# Patient Record
Sex: Female | Born: 1935
Health system: Southern US, Community
[De-identification: ages and names within clinical notes are randomized; demographics above are authoritative.]

## PROBLEM LIST (undated history)

## (undated) DIAGNOSIS — J45909 Unspecified asthma, uncomplicated: Secondary | ICD-10-CM

## (undated) DIAGNOSIS — I10 Essential (primary) hypertension: Secondary | ICD-10-CM

## (undated) HISTORY — PX: ABDOMINAL HYSTERECTOMY: SHX81

---

## 1993-09-30 HISTORY — PX: BREAST EXCISIONAL BIOPSY: SUR124

## 1997-09-30 HISTORY — PX: BREAST BIOPSY: SHX20

## 1998-04-19 ENCOUNTER — Ambulatory Visit (HOSPITAL_COMMUNITY): Admission: RE | Admit: 1998-04-19 | Discharge: 1998-04-19 | Payer: Self-pay | Admitting: General Surgery

## 1998-10-11 ENCOUNTER — Encounter (HOSPITAL_BASED_OUTPATIENT_CLINIC_OR_DEPARTMENT_OTHER): Payer: Self-pay | Admitting: General Surgery

## 1998-10-11 ENCOUNTER — Ambulatory Visit (HOSPITAL_COMMUNITY): Admission: RE | Admit: 1998-10-11 | Discharge: 1998-10-11 | Payer: Self-pay | Admitting: General Surgery

## 1998-11-20 ENCOUNTER — Other Ambulatory Visit: Admission: RE | Admit: 1998-11-20 | Discharge: 1998-11-20 | Payer: Self-pay | Admitting: *Deleted

## 1999-06-19 ENCOUNTER — Encounter: Payer: Self-pay | Admitting: *Deleted

## 1999-06-19 ENCOUNTER — Ambulatory Visit (HOSPITAL_COMMUNITY): Admission: RE | Admit: 1999-06-19 | Discharge: 1999-06-19 | Payer: Self-pay | Admitting: *Deleted

## 2001-03-04 ENCOUNTER — Other Ambulatory Visit: Admission: RE | Admit: 2001-03-04 | Discharge: 2001-03-04 | Payer: Self-pay | Admitting: Internal Medicine

## 2001-05-25 ENCOUNTER — Encounter: Payer: Self-pay | Admitting: Internal Medicine

## 2001-05-25 ENCOUNTER — Encounter: Admission: RE | Admit: 2001-05-25 | Discharge: 2001-05-25 | Payer: Self-pay | Admitting: Internal Medicine

## 2002-05-27 ENCOUNTER — Encounter: Payer: Self-pay | Admitting: Internal Medicine

## 2002-05-27 ENCOUNTER — Encounter: Admission: RE | Admit: 2002-05-27 | Discharge: 2002-05-27 | Payer: Self-pay | Admitting: Internal Medicine

## 2003-06-22 ENCOUNTER — Encounter: Admission: RE | Admit: 2003-06-22 | Discharge: 2003-06-22 | Payer: Self-pay | Admitting: Internal Medicine

## 2003-06-22 ENCOUNTER — Encounter: Payer: Self-pay | Admitting: Internal Medicine

## 2004-09-12 ENCOUNTER — Encounter: Admission: RE | Admit: 2004-09-12 | Discharge: 2004-09-12 | Payer: Self-pay | Admitting: Internal Medicine

## 2005-07-09 ENCOUNTER — Other Ambulatory Visit: Admission: RE | Admit: 2005-07-09 | Discharge: 2005-07-09 | Payer: Self-pay | Admitting: Internal Medicine

## 2005-12-13 ENCOUNTER — Encounter: Admission: RE | Admit: 2005-12-13 | Discharge: 2005-12-13 | Payer: Self-pay | Admitting: Internal Medicine

## 2007-07-29 ENCOUNTER — Encounter: Admission: RE | Admit: 2007-07-29 | Discharge: 2007-07-29 | Payer: Self-pay | Admitting: Internal Medicine

## 2007-12-28 ENCOUNTER — Encounter: Admission: RE | Admit: 2007-12-28 | Discharge: 2007-12-28 | Payer: Self-pay | Admitting: Internal Medicine

## 2008-01-21 ENCOUNTER — Emergency Department (HOSPITAL_COMMUNITY): Admission: EM | Admit: 2008-01-21 | Discharge: 2008-01-22 | Payer: Self-pay | Admitting: Emergency Medicine

## 2008-01-27 ENCOUNTER — Inpatient Hospital Stay (HOSPITAL_COMMUNITY): Admission: EM | Admit: 2008-01-27 | Discharge: 2008-01-29 | Payer: Self-pay | Admitting: Emergency Medicine

## 2008-07-29 ENCOUNTER — Encounter: Admission: RE | Admit: 2008-07-29 | Discharge: 2008-07-29 | Payer: Self-pay | Admitting: Internal Medicine

## 2008-11-10 ENCOUNTER — Inpatient Hospital Stay (HOSPITAL_COMMUNITY): Admission: EM | Admit: 2008-11-10 | Discharge: 2008-11-14 | Payer: Self-pay | Admitting: Emergency Medicine

## 2009-02-09 ENCOUNTER — Emergency Department (HOSPITAL_COMMUNITY): Admission: EM | Admit: 2009-02-09 | Discharge: 2009-02-09 | Payer: Self-pay | Admitting: Emergency Medicine

## 2009-07-19 ENCOUNTER — Emergency Department (HOSPITAL_COMMUNITY): Admission: EM | Admit: 2009-07-19 | Discharge: 2009-07-19 | Payer: Self-pay | Admitting: Emergency Medicine

## 2009-07-19 ENCOUNTER — Emergency Department (HOSPITAL_COMMUNITY): Admission: EM | Admit: 2009-07-19 | Discharge: 2009-07-20 | Payer: Self-pay | Admitting: Emergency Medicine

## 2009-08-04 ENCOUNTER — Encounter: Admission: RE | Admit: 2009-08-04 | Discharge: 2009-08-04 | Payer: Self-pay | Admitting: Internal Medicine

## 2010-01-07 IMAGING — CT CT HEAD W/O CM
2 series · 16 of 30 positions shown, 18 images · non-contrast
Comparison: None

CLINICAL DATA: Right leg weakness, fell 4 days ago

CT HEAD WITHOUT CONTRAST
TECHNIQUE: Contiguous axial images were obtained from the base of
the skull through the vertex without contrast.

[Series 3: bone windows · axial · 0.43mm/px · z∈[+2,+113]mm · 8 of 56 slices shown]
[im 6/56  bone]
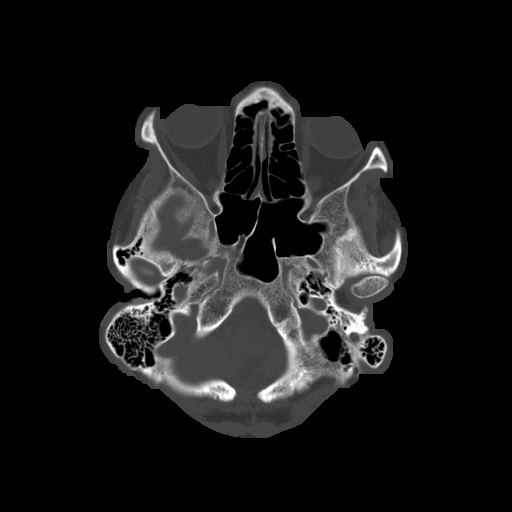
[im 12/56  bone]
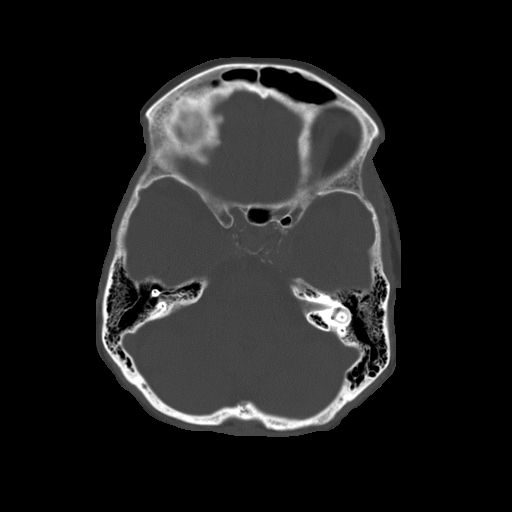
[im 18/56  bone]
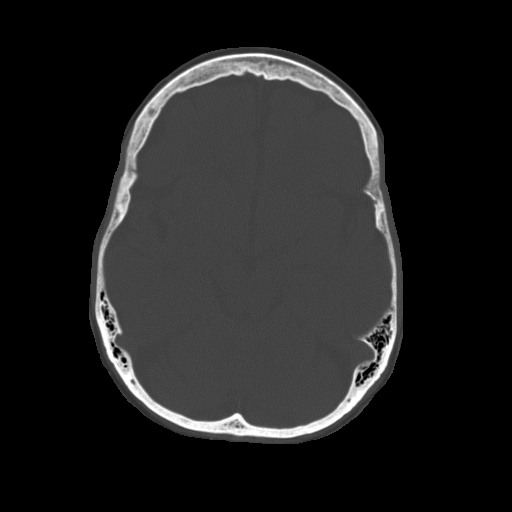
[im 24/56  bone]
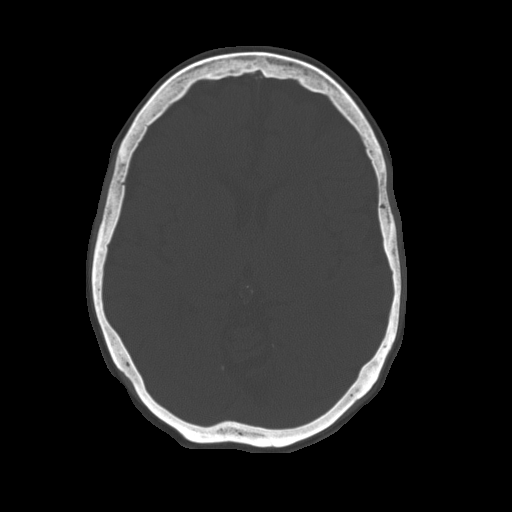
[im 32/56  bone]
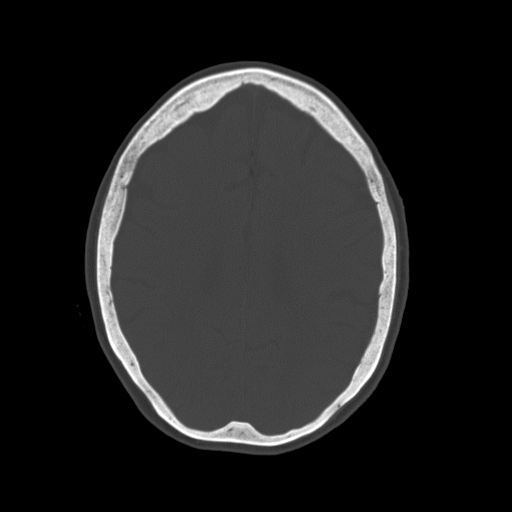
[im 38/56  bone]
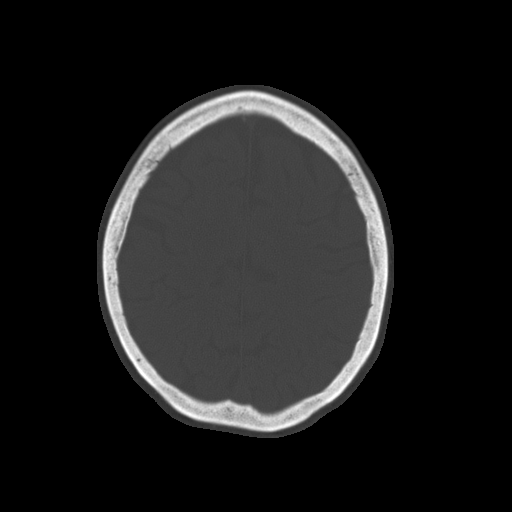
[im 44/56  bone]
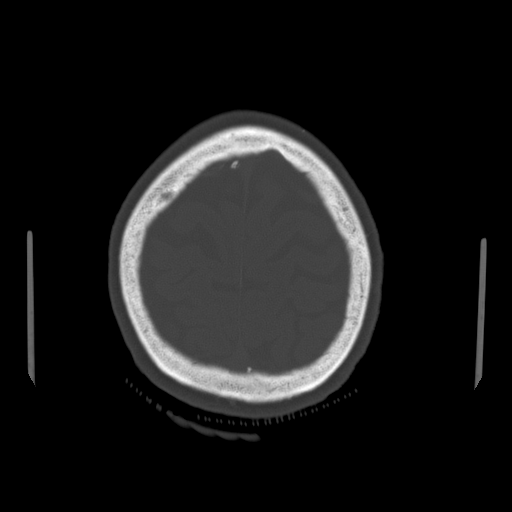
[im 50/56  bone]
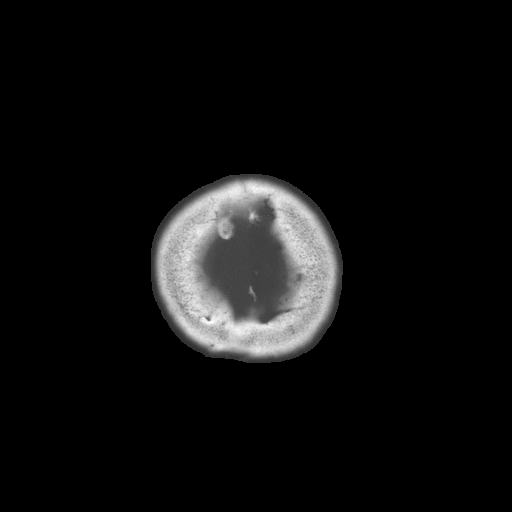

[Series 32: 3d filtered head w/o · axial · non-contrast · 0.43mm/px · z∈[+6,+111]mm · 8 of 28 slices shown, 10 images]
[im 4/28  brain]
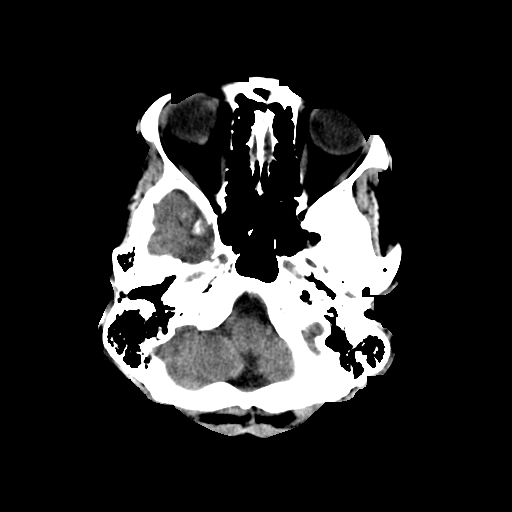
[im 4/28  bone]
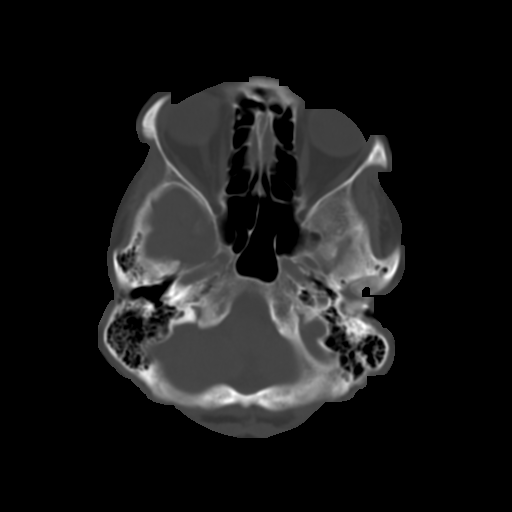
[im 7/28  brain]
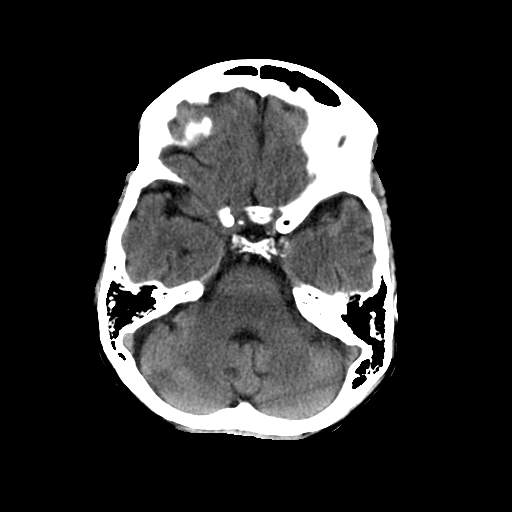
[im 10/28  brain]
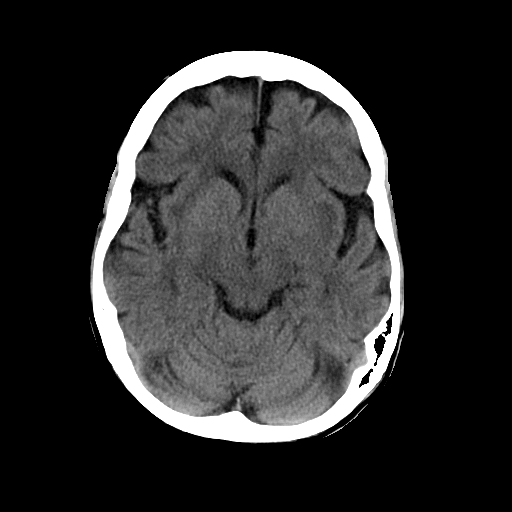
[im 13/28  brain]
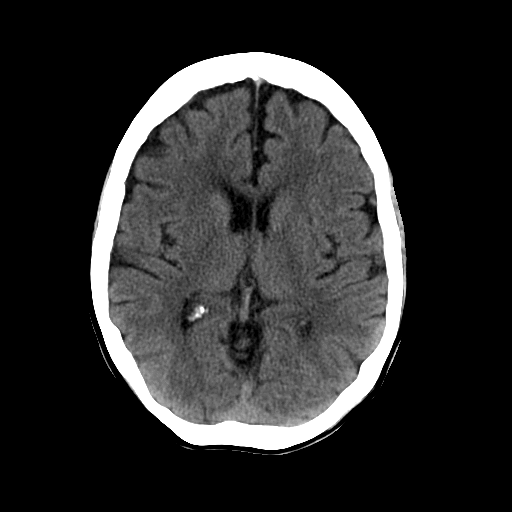
[im 16/28  brain]
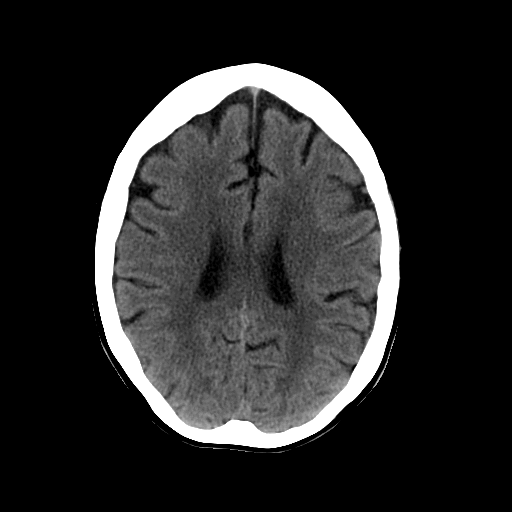
[im 16/28  bone]
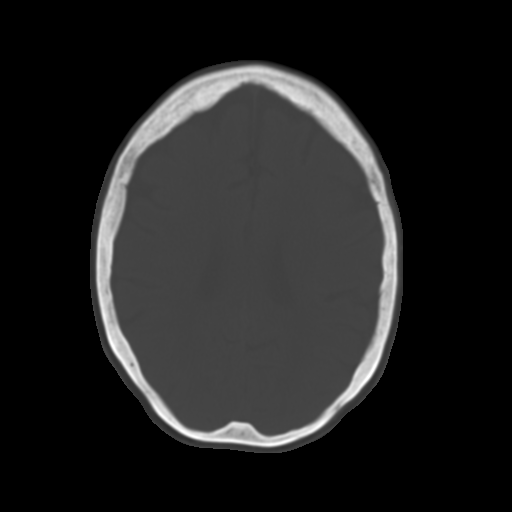
[im 19/28  brain]
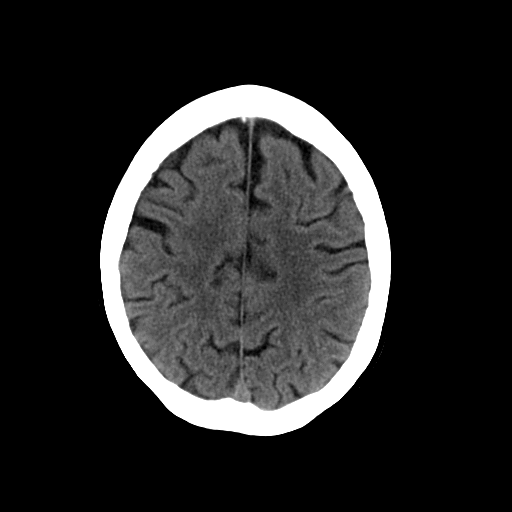
[im 22/28  brain]
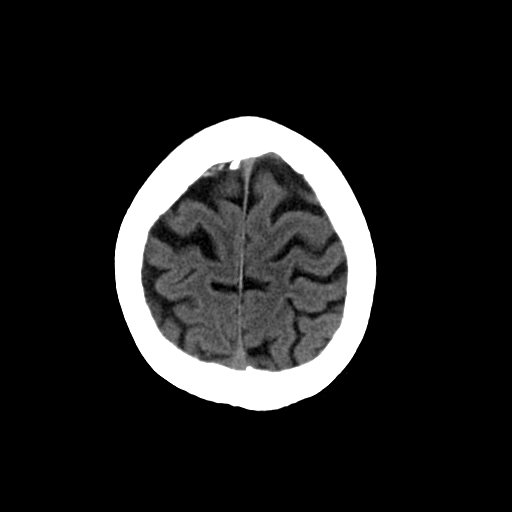
[im 25/28  brain]
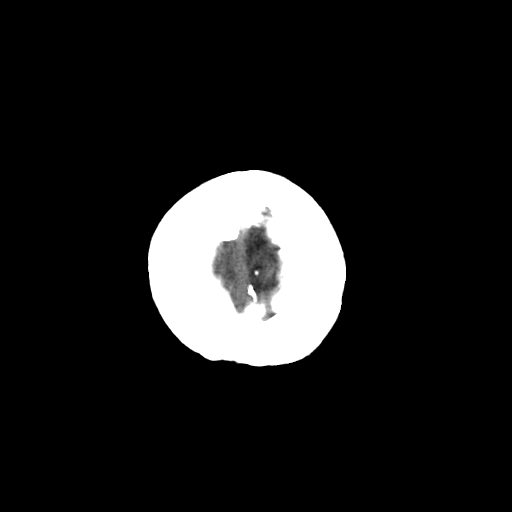

[16 of 30 positions shown; findings below may reference images not displayed]

FINDINGS: The ventricular system is within normal limits in size
and configuration.  There is cortical atrophy present diffusely.
The septum is midline in position.  Moderate small vessel ischemic
change is noted throughout the periventricular white matter.  No
blood, edema, or mass effect is seen.  No bony abnormality is
noted.
IMPRESSION: Atrophy and small vessel disease.  No acute intracranial
abnormality.

## 2010-05-24 ENCOUNTER — Encounter: Admission: RE | Admit: 2010-05-24 | Discharge: 2010-05-24 | Payer: Self-pay | Admitting: Gastroenterology

## 2010-09-19 ENCOUNTER — Encounter
Admission: RE | Admit: 2010-09-19 | Discharge: 2010-09-19 | Payer: Self-pay | Source: Home / Self Care | Attending: Internal Medicine | Admitting: Internal Medicine

## 2010-10-08 ENCOUNTER — Encounter
Admission: RE | Admit: 2010-10-08 | Discharge: 2010-10-08 | Payer: Self-pay | Source: Home / Self Care | Attending: Internal Medicine | Admitting: Internal Medicine

## 2010-10-17 ENCOUNTER — Encounter
Admission: RE | Admit: 2010-10-17 | Discharge: 2010-10-17 | Payer: Self-pay | Source: Home / Self Care | Attending: Internal Medicine | Admitting: Internal Medicine

## 2010-10-20 ENCOUNTER — Other Ambulatory Visit: Payer: Self-pay | Admitting: Gastroenterology

## 2010-10-20 DIAGNOSIS — K862 Cyst of pancreas: Secondary | ICD-10-CM

## 2010-11-09 ENCOUNTER — Other Ambulatory Visit: Payer: Self-pay

## 2011-01-03 LAB — RAPID URINE DRUG SCREEN, HOSP PERFORMED
Amphetamines: NOT DETECTED
Barbiturates: NOT DETECTED
Opiates: NOT DETECTED
Tetrahydrocannabinol: NOT DETECTED

## 2011-01-03 LAB — URINALYSIS, ROUTINE W REFLEX MICROSCOPIC
Glucose, UA: NEGATIVE mg/dL
Ketones, ur: >80 mg/dL — AB
Nitrite: NEGATIVE
Specific Gravity, Urine: 1.021 (ref 1.005–1.030)
Urobilinogen, UA: 0.2 mg/dL (ref 0.0–1.0)
pH: 6 (ref 5.0–8.0)

## 2011-01-03 LAB — CBC
HCT: 41.4 % (ref 36.0–46.0)
MCHC: 34.3 g/dL (ref 30.0–36.0)
Platelets: 319 10*3/uL (ref 150–400)
RBC: 4.45 MIL/uL (ref 3.87–5.11)

## 2011-01-03 LAB — DIFFERENTIAL
Basophils Absolute: 0 10*3/uL (ref 0.0–0.1)
Basophils Relative: 0 % (ref 0–1)
Eosinophils Absolute: 0 10*3/uL (ref 0.0–0.7)
Lymphs Abs: 2.2 10*3/uL (ref 0.7–4.0)
Monocytes Relative: 9 % (ref 3–12)

## 2011-01-03 LAB — URINE CULTURE: Colony Count: 60000

## 2011-01-03 LAB — GLUCOSE, CAPILLARY: Glucose-Capillary: 96 mg/dL (ref 70–99)

## 2011-01-03 LAB — BASIC METABOLIC PANEL
BUN: 13 mg/dL (ref 6–23)
CO2: 25 mEq/L (ref 19–32)
Calcium: 9.1 mg/dL (ref 8.4–10.5)

## 2011-01-03 LAB — URINE MICROSCOPIC-ADD ON

## 2011-01-15 LAB — POTASSIUM: Potassium: 3.5 mEq/L (ref 3.5–5.1)

## 2011-01-15 LAB — BLOOD GAS, ARTERIAL
Acid-base deficit: 14.6 mmol/L — ABNORMAL HIGH (ref 0.0–2.0)
Bicarbonate: 10.3 mEq/L — ABNORMAL LOW (ref 20.0–24.0)
O2 Saturation: 95.7 %
Patient temperature: 98.6
TCO2: 9.6 mmol/L (ref 0–100)
pO2, Arterial: 89.5 mmHg (ref 80.0–100.0)

## 2011-01-15 LAB — GLUCOSE, CAPILLARY
Glucose-Capillary: 104 mg/dL — ABNORMAL HIGH (ref 70–99)
Glucose-Capillary: 116 mg/dL — ABNORMAL HIGH (ref 70–99)
Glucose-Capillary: 124 mg/dL — ABNORMAL HIGH (ref 70–99)
Glucose-Capillary: 144 mg/dL — ABNORMAL HIGH (ref 70–99)
Glucose-Capillary: 180 mg/dL — ABNORMAL HIGH (ref 70–99)
Glucose-Capillary: 211 mg/dL — ABNORMAL HIGH (ref 70–99)
Glucose-Capillary: 96 mg/dL (ref 70–99)

## 2011-01-15 LAB — CBC
HCT: 30.3 % — ABNORMAL LOW (ref 36.0–46.0)
HCT: 30.4 % — ABNORMAL LOW (ref 36.0–46.0)
HCT: 33.6 % — ABNORMAL LOW (ref 36.0–46.0)
Hemoglobin: 10.3 g/dL — ABNORMAL LOW (ref 12.0–15.0)
Hemoglobin: 10.4 g/dL — ABNORMAL LOW (ref 12.0–15.0)
Hemoglobin: 11.4 g/dL — ABNORMAL LOW (ref 12.0–15.0)
MCHC: 33.9 g/dL (ref 30.0–36.0)
MCHC: 34 g/dL (ref 30.0–36.0)
MCHC: 34.1 g/dL (ref 30.0–36.0)
MCV: 97.2 fL (ref 78.0–100.0)
Platelets: 92 10*3/uL — ABNORMAL LOW (ref 150–400)
RBC: 3.13 MIL/uL — ABNORMAL LOW (ref 3.87–5.11)
RBC: 3.23 MIL/uL — ABNORMAL LOW (ref 3.87–5.11)
RBC: 3.45 MIL/uL — ABNORMAL LOW (ref 3.87–5.11)
RDW: 14.4 % (ref 11.5–15.5)
WBC: 3.2 10*3/uL — ABNORMAL LOW (ref 4.0–10.5)

## 2011-01-15 LAB — COMPREHENSIVE METABOLIC PANEL
ALT: 25 U/L (ref 0–35)
ALT: 35 U/L (ref 0–35)
AST: 53 U/L — ABNORMAL HIGH (ref 0–37)
AST: 72 U/L — ABNORMAL HIGH (ref 0–37)
Albumin: 3.5 g/dL (ref 3.5–5.2)
Albumin: 4.4 g/dL (ref 3.5–5.2)
Alkaline Phosphatase: 64 U/L (ref 39–117)
Alkaline Phosphatase: 69 U/L (ref 39–117)
BUN: 9 mg/dL (ref 6–23)
CO2: 10 mEq/L — ABNORMAL LOW (ref 19–32)
CO2: 17 mEq/L — ABNORMAL LOW (ref 19–32)
CO2: 26 mEq/L (ref 19–32)
Calcium: 8.3 mg/dL — ABNORMAL LOW (ref 8.4–10.5)
Chloride: 102 mEq/L (ref 96–112)
Creatinine, Ser: 1.04 mg/dL (ref 0.4–1.2)
GFR calc Af Amer: >60 mL/min (ref >60)
GFR calc Af Amer: >60 mL/min (ref >60)
GFR calc non Af Amer: 44 mL/min — ABNORMAL LOW (ref >60)
GFR calc non Af Amer: 52 mL/min — ABNORMAL LOW (ref >60)
GFR calc non Af Amer: >60 mL/min (ref >60)
Glucose, Bld: 112 mg/dL — ABNORMAL HIGH (ref 70–99)
Potassium: 4 mEq/L (ref 3.5–5.1)
Potassium: 4.5 mEq/L (ref 3.5–5.1)
Sodium: 134 mEq/L — ABNORMAL LOW (ref 135–145)
Sodium: 135 mEq/L (ref 135–145)
Total Bilirubin: 2 mg/dL — ABNORMAL HIGH (ref 0.3–1.2)
Total Bilirubin: 2.7 mg/dL — ABNORMAL HIGH (ref 0.3–1.2)
Total Protein: 5.6 g/dL — ABNORMAL LOW (ref 6.0–8.3)

## 2011-01-15 LAB — LIPASE, BLOOD: Lipase: 390 U/L — ABNORMAL HIGH (ref 11–59)

## 2011-01-15 LAB — CARDIAC PANEL(CRET KIN+CKTOT+MB+TROPI)
CK, MB: 7.1 ng/mL — ABNORMAL HIGH (ref 0.3–4.0)
CK, MB: 8.1 ng/mL — ABNORMAL HIGH (ref 0.3–4.0)
Relative Index: 1.5 (ref 0.0–2.5)
Relative Index: 1.5 (ref 0.0–2.5)

## 2011-01-15 LAB — BASIC METABOLIC PANEL
BUN: 2 mg/dL — ABNORMAL LOW (ref 6–23)
Calcium: 8.9 mg/dL (ref 8.4–10.5)
Chloride: 96 mEq/L (ref 96–112)
Creatinine, Ser: 0.79 mg/dL (ref 0.4–1.2)
GFR calc Af Amer: >60 mL/min (ref >60)
GFR calc non Af Amer: >60 mL/min (ref >60)
GFR calc non Af Amer: >60 mL/min (ref >60)
Potassium: 3.5 mEq/L (ref 3.5–5.1)
Sodium: 140 mEq/L (ref 135–145)

## 2011-01-15 LAB — URINE CULTURE: Colony Count: NO GROWTH

## 2011-01-15 LAB — URINALYSIS, ROUTINE W REFLEX MICROSCOPIC
Glucose, UA: NEGATIVE mg/dL
Protein, ur: 100 mg/dL — AB
Specific Gravity, Urine: 1.015 (ref 1.005–1.030)
Urobilinogen, UA: 0.2 mg/dL (ref 0.0–1.0)
pH: 5.5 (ref 5.0–8.0)

## 2011-01-15 LAB — FOLATE: Folate: >20 ng/mL

## 2011-01-15 LAB — DIFFERENTIAL
Basophils Absolute: 0 10*3/uL (ref 0.0–0.1)
Basophils Relative: 0 % (ref 0–1)
Eosinophils Absolute: 0 10*3/uL (ref 0.0–0.7)
Eosinophils Relative: 0 % (ref 0–5)
Monocytes Absolute: 0.4 10*3/uL (ref 0.1–1.0)
Monocytes Relative: 5 % (ref 3–12)
Neutro Abs: 8.6 10*3/uL — ABNORMAL HIGH (ref 1.7–7.7)

## 2011-01-15 LAB — TROPONIN I: Troponin I: 0.01 ng/mL (ref 0.00–0.06)

## 2011-01-15 LAB — CK TOTAL AND CKMB (NOT AT ARMC): Relative Index: 1.4 (ref 0.0–2.5)

## 2011-01-15 LAB — CULTURE, BLOOD (ROUTINE X 2): Culture: NO GROWTH

## 2011-01-15 LAB — URINE MICROSCOPIC-ADD ON

## 2011-01-15 LAB — VITAMIN B12: Vitamin B-12: 544 pg/mL (ref 211–911)

## 2011-01-15 LAB — APTT: aPTT: 25 seconds (ref 24–37)

## 2011-01-15 LAB — IRON AND TIBC: Iron: 76 ug/dL (ref 42–135)

## 2011-02-12 NOTE — H&P (Signed)
Audrey [name], Audrey [name]               ACCOUNT NO.:  [id number]   MEDICAL RECORD NO.:  [id number]          PATIENT TYPE:  EMS   LOCATION:  ED                           FACILITY:  [facility]   PHYSICIAN:  Corinna L. Lendell Caprice, MDDATE OF BIRTH:  1933/09/10   DATE OF ADMISSION:  11/10/2008  DATE OF DISCHARGE:                              HISTORY & PHYSICAL   CHIEF COMPLAINT:  Is vomiting.   HISTORY OF PRESENT ILLNESS:  Audrey [name] is a 77 year old black female  patient of Dr. Donette Larry who presents with vomiting today.  She is a rather  difficult historian, changing her story several times during the  interview.  Initially she said that she became sick all of a sudden  today.  Subsequently she said that she started feeling bad yesterday  and upon further questioning, it sounds as though she has not eaten or  drank much for nearly a week.  She does feel weak and dizzy.  She has a  history of asthma.  She also had some periodic left-sided chest pressure  over the past week.  She has no history of pancreatitis.  Initially she  said she drank occasionally but upon further questioning, she reports  that she drinks a gallon of liquor every week and she thinks she feels  shaky after abstaining today.  She has no hallucinations.  She has no  cough.  She has had no diarrhea.  She has had no fevers or chills.  She  had no hematemesis.  She was found to have a metabolic acidosis and  pancreatitis based on labs drawn by the ED physician.  Unfortunately,  CBC and other workup are not yet back.  She has not received anything  for her hypoglycemia or acidosis.  She lives alone.  She has been  depressed since her husband died and her drinking has since accelerated  over the past decade.   PAST MEDICAL HISTORY:  1. Asthma.  2. Gastroesophageal reflux disease.  3. Hypertension.  4. Anxiety.  5. Depression.  6. Hyperlipidemia.   MEDICATIONS:  Per triage notes include:  1. Albuterol as needed.  2. Amlodipine 5  mg daily.  3. Prozac 40 mg daily.  4. Hydrochlorothiazide 25 mg daily.  5. Actonel.   ALLERGIES:  SHE REPORTS AN ALLERGY TO PENICILLIN WHICH CAUSES  HALLUCINATIONS.   SOCIAL HISTORY:  As above.  The patient does not smoke.  She denies drug  use.   FAMILY HISTORY:  Her mother died of breast cancer.  Her father had lung  cancer.   PAST SURGICAL HISTORY:  Hysterectomy.   REVIEW OF SYSTEMS:  As above.  Otherwise negative.   PHYSICAL EXAMINATION:  Temperature is 98.2, blood pressure 134/55, pulse  99, respiratory rate 16, oxygen saturation 98% on room air.  IN GENERAL:  The patient is a thin black female who appears somewhat  anxious.  HEENT:  Normocephalic, atraumatic.  Pupils equal, round, reactive to  light.  Sclerae nonicteric.  She has a very dry mucous membranes.  NECK:  Is supple.  No lymphadenopathy.  LUNGS:  Are clear to auscultation  bilaterally without wheezes, rhonchi  or rales.  CARDIOVASCULAR:  Regular rate and rhythm without murmurs, gallops or  rubs.  ABDOMEN:  Normal bowel sounds.  She has some lower abdominal tenderness  and voluntary guarding.  Her abdomen is soft.  GU AND RECTAL:  Deferred.  EXTREMITIES:  No clubbing, cyanosis or edema.  NEUROLOGIC:  The patient is anxious.  She is rather tremulous.  She is  oriented x3.  Cranial nerves and sensorimotor exam are intact.  SKIN:  No rash.  She has tenting and poor turgor.  PSYCHIATRIC:  As above.   LABORATORIES:  Sodium is 139, potassium 4.5, chloride 102, bicarbonate  10, glucose 59, BUN 9, creatinine 1.04.  Total bilirubin 2.0, alkaline  phosphatase 69, SGOT 72, SGPT 31, total protein 7.1, albumin 4.4,  calcium 8.4, lipase 330.   ASSESSMENT/PLAN:  1. Pancreatitis, suspect alcohol related.  The patient will be      admitted to step-down.  She will be n.p.o.  I will check a CT of      the abdomen and pelvis as I am concerned about possibly sepsis      related to this.  2. Increased anion gap acidosis.   Suspect secondary to above, but      cannot rule out sepsis.  CBC is pending.  I will also check a      urinalysis culture, PA and lateral chest x-ray as well as blood      cultures.  I will start empiric Cipro and Flagyl while all the      database is pending.  Also see above.  Lactic acid level is pending  3. Alcohol abuse/dependence.  The patient will get thiamine,      multivitamin, Ativan now and every 4 hours as needed.  4. Hypoglycemia secondary to pancreatitis, alcohol use and poor      intake.  I will give an amp of D50 intravenous now and place her on      D5W with an amp of bicarbonate at 150 mL an hour.  5. Stable asthma.  Give p.r.n. with albuterol and 2 liters of oxygen.  6. Reported history of left chest pain.  I will order a stat EKG and      serial cardiac enzymes.  7. Gastroesophageal reflux disease.  She will be on intravenous      Protonix.  8. Hypertension.  She will get intravenous hydralazine while n.p.o.  9. Anxiety, depression.  Hold Prozac.  10.Dehydration.  11.Hyperlipidemia.      Corinna L. Lendell Caprice, MD  Electronically Signed     CLS/MEDQ  D:  11/10/2008  T:  11/10/2008  Job:  [id number]   cc:   Georgann Housekeeper, MD  Fax: [phone]

## 2011-02-12 NOTE — Discharge Summary (Signed)
[name], [name]               ACCOUNT NO.:  [id number]   MEDICAL RECORD NO.:  [id number]          PATIENT TYPE:  INP   LOCATION:  1304                         FACILITY:  [facility]   PHYSICIAN:  Georgann Housekeeper, MD      DATE OF BIRTH:  [birth year]   DATE OF ADMISSION:  11/10/2008  DATE OF DISCHARGE:  11/14/2008                               DISCHARGE SUMMARY   DIAGNOSES:  1. Acute pancreatitis.  2. Ethanol abuse.  3. Pancreatitis due to alcohol.  4. Dehydration.  5. Hypokalemia.  6. Pancytopenia due to ethanol.  7. History of hypertension.  8. History of anxiety.  9. History of gastroesophageal reflux disease.   MEDICATION ON DISCHARGE:  1. Albuterol MDI p.r.n. 2 puffs.  2. Amlodipine 5 mg daily.  3. Fluoxetine 40 mg daily.  4. Actonel 35 mg once a day.  5. Multivitamin 1 daily.  6. Thiamine 100 mg daily.  7. Prilosec 20 mg daily.  8. Hydrochlorothiazide has been discontinued.  9. No aspirin products.   LABORATORY DATA:  White count of 3.2, hemoglobin 10.4, platelet count  92.  Sodium 140, potassium 3.5, creatinine 0.61, bicarb of 32, sugar  125.  LFTs:  AST 53, ALT 25, alkaline phosphatase 55, improved.  Amylase  305 improved from 500, lipase 132 improved from 330.  Troponin negative.  CPK initially 532 down to 459.  Blood cultures remained negative.  Urine  culture negative.  B12 level 554.  Folate greater than 20, iron 76.  CT  of the abdomen and pelvic showed pancreatic inflammation and some fatty  liver; otherwise, unremarkable.  UA was negative.   Blood pressure 137/65, heart rate 90, respirations 20, 98.4 temperature.   HOSPITAL COURSE:  34. A 77 year old female with history of hypertension, anxiety, and      GERD with alcohol abuse.  The patient had been drinking regularly      at home.  Presented with dehydration, abdominal pain, nausea,      vomiting, and acidosis.  Found to have acute pancreatitis.  CT of      the abdomen and pelvis suggested pancreatic  inflammation without      any phlegmon or abscess or mass.  She was started on IV fluids,      aggressive hydration, NPO, and improved much significantly within 2      days.  She was started on liquids, and advanced diet without any      problems.  She has very little abdominal pain; required some pain      medication for the first day or so.  Pancreatitis thought to be      secondary to alcohol use.  2. EtOH abuse.  The patient has been drinking chronically.  She was      found to have pancytopenia, no evidence of any GI bleed.  No      evidence of any DTs.  She had p.r.n. Ativan.  Mental status      remained stable.  3. History of hypertension.  Her blood pressure medication was held      when she came  in because of the dehydration.  We will start on      amlodipine at home.  HCTZ will be held for now.  Recheck blood      pressure at home and follow up if needed.  4. History of anxiety.  Continue on fluoxetine 40 mg daily.  The      patient was started on multivitamin and thiamine for her EtOH.      Continue on PPIs for her GERD.  Encourage her to stay away from      alcohol.  Will recheck her blood counts in 1 week.      Georgann Housekeeper, MD  Electronically Signed     KH/MEDQ  D:  11/14/2008  T:  11/14/2008  Job:  [id number]

## 2011-02-12 NOTE — H&P (Signed)
Audrey [name], [name] NO.:  [id number]   MEDICAL RECORD NO.:  [id number]          PATIENT TYPE:  EMS   LOCATION:  ED                           FACILITY[facility]   PHYSICIAN:  Ramiro Harvest, MD    DATE OF BIRTH:  [birth year]   DATE OF ADMISSION:  01/27/2008  DATE OF DISCHARGE:                              HISTORY & PHYSICAL   ATTENDING PHYSICIAN:  Dr. Ramiro Harvest.   PRIMARY CARE PHYSICIAN:  Dr. Georgann Housekeeper of [facility].   HISTORY OF PRESENT ILLNESS:  Audrey [name] is a 77 year old African  American female, history of asthma without prior intubations, history of  hypertension, history of depression, who presented to the ED with a 1-  week history of intermittent worsening shortness of breath, wheezing,  and chest tightness.  Per patient, 8 days prior to admission, she had  had bouts of shortness of breath, wheezing and chest tightness alongside  a nonproductive cough and presented to the ED, where she states she was  given some IM injections and sent home with inhalers with some relief.  The patient states ever since then she has had intermittent worsening of  her symptoms which are worse at night and got so short of breath with  wheezing and chest tightness which started the night prior to admission  and into the morning of admission with some decreased sleep and a dry  nonproductive cough that she called the EMS and was brought to the ED.  Per patient, en route she was given some steroids and some neb  treatments with some relief.  The patient denies any fever.  No chills,  no nausea, no vomiting.  No chest pain.  No abdominal pain.  No  hematemesis, no hematochezia.  No recent upper respiratory infection.  No pets at home and no other associated symptoms.  The patient has tried  Proventil with some minimal relief, and per the PCP the patient had been  out of albuterol inhalers.  In the ED, the patient was given IV Solu-  Medrol, some neb  treatments.  Chest x-ray showed no infiltrate.  The  patient was still wheezing with some shortness of breath on ambulation  in the ED.  As such, we were called to admit the patient for further  evaluation and management.   ALLERGIES:  PENICILLIN CAUSES HALLUCINATIONS.   PAST MEDICAL HISTORY:  1. Hypertension.  2. Asthma for approximately 20 years.  3. Depression.  4. GERD.  5. Dyslipidemia.  6. Bilateral cataracts, status post right eye laser surgery on January 20, 2008, per Dr. Nile Riggs.   HOME MEDICATIONS:  1. HCTZ 25 mg daily.  2. Norvasc 5 mg daily.  3. Proventil inhaler p.r.n.  4. Prozac 40 mg daily.  5. Calcium with vitamin D 500 mg 2 tablets daily.  6. KCl elixir 10 mEq b.i.d.  7. Actonel 35 mg weekly.  8. Triamcinolone cream 0.01% b.i.d. p.r.n.  9. Mevacor 40 mg q.h.s.   SOCIAL HISTORY:  The patient is widowed, lives in [place] by herself.  Had one son who  was deceased in his 30s from pneumonia.  No tobacco use.  No IV drug use.  Occasional alcohol use.   FAMILY HISTORY:  Mother deceased in her 81s from breast cancer.  Father  deceased from lung cancer.  One brother alive, approximately age 62,  health history unknown.   REVIEW OF SYSTEMS:  As per HPI, otherwise negative.   PHYSICAL EXAMINATION:  VITAL SIGNS:  Temperature 97.7, blood pressure  139/68, pulse went from 85 to 108, respiratory rate 20, satting 99% on  room air.  GENERAL:  The patient in no apparent distress.  No use of accessory  muscles of respiration.  HEENT:  Normocephalic, atraumatic.  Pupils equal, round and reactive to  light.  Extraocular movements intact.  Oropharynx is clear.  No lesions,  no exudates.  NECK:  Supple.  No lymphadenopathy.  RESPIRATORY:  Positive expiratory wheezes.  No use of accessory muscles.  No crackles.  No rhonchi.  CARDIOVASCULAR:  Regular rate and rhythm.  No murmurs, rubs or gallops.  ABDOMEN:  Soft, nontender, nondistended.  Positive bowel sounds.   EXTREMITIES:  No clubbing, cyanosis or edema.  NEUROLOGIC:  The patient alert and oriented x3.  Cranial nerves II-XII  are grossly intact.  No focal deficits.   LABORATORIES:  CBC:  White count 6.7, hemoglobin 12.2, platelets 282,  hematocrit 37, ANC of 3.4.  BMET:  Sodium 141, potassium 3.4, chloride  102, bicarb 30, BUN 7, creatinine 0.61, glucose 129, calcium of 9.8.  BNP less than 30.  UA was yellow, clear, specific gravity 1.008, pH of  7, glucose negative, bilirubin negative, ketones negative, blood  negative, protein negative, urobilinogen 0.2, nitrite negative,  leukocytes negative.  Chest x-ray shows linear density in the right  infrahilar  region, may be prominent vessel.  Parenchymal lesion cannot  be excluded.  Followup PA and lateral chest x-ray in 3 months.   ASSESSMENT AND PLAN:  Mr. Yolandra Habig is a 77 year old female with  history of asthma, hypertension, depression, gastroesophageal reflux  disease, who presents to the ED with worsening shortness of breath,  wheezing and chest tightness.   PROBLEM:  1. Acute asthma exacerbation, likely secondary to pollen versus GERD.      The patient with no prior history of intubations.  No prior upper      respiratory infections or acute infections.  The patient states      worsening symptoms at night may be likely triggered by GERD.  Check      a hepatic panel.  Check a magnesium level.  Check a sputum Gram      stain and culture.  Solu-Medrol 125 mg IV q. 12 h, albuterol and      Atrovent nebs, Protonix and follow.  2. Hypertension.  Norvasc 5 mg daily.  Hold HCTZ for now.  3. Depression.  Prozac 40 mg daily.  4. Hyperlipidemia.  Mevacor.  5. Osteoarthritis.  Hold Actonel and calcium with vitamin D for now.      6.  Gastroesophageal reflux disease.  Protonix.  6. Bilateral cataracts, status post right eye surgery on January 20, 2008.  7. Hypokalemia.  Check a magnesium level and replete.  8. Questionable linear density,  right infrahilar region.  Follow up PA      and lateral chest x-ray in 3 months.   It was a pleasure taking care of Audrey [name].      Ramiro Harvest, MD  Electronically Signed  DT/MEDQ  D:  01/27/2008  T:  01/27/2008  Job:  [id number]   cc:   Georgann Housekeeper, MD  Fax: [phone]

## 2011-02-12 NOTE — Discharge Summary (Signed)
Audrey [name], [name]               ACCOUNT NO.:  [id number]   MEDICAL RECORD NO.:  [id number]          PATIENT TYPE:  INP   LOCATION:  1416                         FACILITY:  [facility]   PHYSICIAN:  Georgann Housekeeper, MD      DATE OF BIRTH:  1933/08/07   DATE OF ADMISSION:  01/27/2008  DATE OF DISCHARGE:  01/29/2008                               DISCHARGE SUMMARY   DISCHARGE DIAGNOSES:  1. Asthma exacerbation.  2. Gastroesophageal reflux disease.  3. Hypertension.  4. Anxiety disorder and depression.  5. Dyslipidemia.   MEDICATIONS ON DISCHARGE:  1. HCTZ 25 mg daily.  2. Norvasc 5 mg daily.  3. Prozac 40 mg daily.  4. Proventil MDI 2 puffs q. 4-6 p.r.n.  5. Calcium plus D 2 a day.  6. Potassium elixir 10 mEq twice a day.  7. Actonel 35 mg once a week.  8. Mevacor 40 mg daily.  9. Prednisone taper 20 mg 3 tablets for 2 days, 2 tablets for 3 days,      1 tablet for 3 days.  10.Protonix 40 mg 1 daily.  11.Albuterol nebulizer 3-4 times a day as needed.  12.Z-Pak as directed.  13.Home health for nebulizer treatments.   HOSPITAL COURSE:  1. The patient is 77 years old with the above medical conditions      admitted with wheezing starting about 2 or 3 days ago.  She had      some albuterol but did not help her much.  She came in short of      breath and wheezing.  Chest x-ray was negative for pneumonia, they      saw some right infrahilar region prominent vessels, recommending      repeating a chest x-ray in 3-4 months.  She was started on Solu-      Medrol IV as well as nebulizers, significant improvement within 24      hours noted.  The patient was continued on the nebulizer      treatments, her steroids were tapered to prednisone p.o. and she      will continue to taper that.  As far as her white count, it went up      with the addition of IV steroids thought to be secondary to      demargination.  Clinically she improved, no fevers or chills,      continues to have some dry cough,  was treated for possible      bronchitis with azithromycin.  As far as her other triggers, could      be GERD versus seasonal allergies.  The patient will continue on      Protonix at home.  She could not remember if she was taking over-      the-counter acid medication.  2. Hypertension:  Patient continued on Norvasc, hydrochlorothiazide,      blood pressure remained stable in the 120s.  3. GERD:  Continue on Protonix.  4. Depression, anxiety:  Continue on Prozac.   LABORATORY DATA:  White count was 16.6 at time of discharge, hemoglobin  11.3.  Potassium 4.1,  creatinine 0.5, glucose 145. Sputum culture was  negative.  Respiratory culture showed some mixed growth.  Urine was  negative.  Her BNP was less than 30.  UA was negative.   FOLLOWUP:  Outpatient in 2 weeks.      Georgann Housekeeper, MD  Electronically Signed     KH/MEDQ  D:  01/29/2008  T:  01/29/2008  Job:  [id number]

## 2011-06-13 ENCOUNTER — Emergency Department (HOSPITAL_COMMUNITY): Payer: Medicare Other

## 2011-06-13 ENCOUNTER — Emergency Department (HOSPITAL_COMMUNITY)
Admission: EM | Admit: 2011-06-13 | Discharge: 2011-06-13 | Disposition: A | Discharge status: Home / Self Care | Payer: Medicare Other | Attending: Emergency Medicine | Admitting: Emergency Medicine

## 2011-06-13 DIAGNOSIS — M129 Arthropathy, unspecified: Secondary | ICD-10-CM | POA: Insufficient documentation

## 2011-06-13 DIAGNOSIS — I10 Essential (primary) hypertension: Secondary | ICD-10-CM | POA: Insufficient documentation

## 2011-06-13 DIAGNOSIS — M25559 Pain in unspecified hip: Secondary | ICD-10-CM | POA: Insufficient documentation

## 2011-06-25 LAB — CULTURE, RESPIRATORY W GRAM STAIN

## 2011-06-25 LAB — BASIC METABOLIC PANEL
BUN: 6
CO2: 28
CO2: 30
Calcium: 9.2
Chloride: 102
Chloride: 103
Creatinine, Ser: 0.56
Creatinine, Ser: 0.61
GFR calc Af Amer: >60
Potassium: 3.4 — ABNORMAL LOW
Sodium: 141

## 2011-06-25 LAB — CBC
HCT: 37
Hemoglobin: 12.1
MCHC: 32.9
MCHC: 34.1
MCV: 95.6
MCV: 95.6
Platelets: 277
RBC: 3.87
WBC: 14.6 — ABNORMAL HIGH
WBC: 6.7

## 2011-06-25 LAB — DIFFERENTIAL
Basophils Relative: 0
Eosinophils Absolute: 0
Eosinophils Absolute: 0.8 — ABNORMAL HIGH
Lymphs Abs: 1.9
Monocytes Absolute: 0.5
Monocytes Relative: 8
Neutrophils Relative %: 51
Neutrophils Relative %: 93 — ABNORMAL HIGH

## 2011-06-25 LAB — HEPATIC FUNCTION PANEL
Albumin: 3.9
Alkaline Phosphatase: 57
Bilirubin, Direct: <0.1
Total Bilirubin: 1

## 2011-06-25 LAB — URINALYSIS, ROUTINE W REFLEX MICROSCOPIC
Bilirubin Urine: NEGATIVE
Nitrite: NEGATIVE
Specific Gravity, Urine: 1.008
Urobilinogen, UA: 0.2
pH: 7

## 2011-06-25 LAB — B-NATRIURETIC PEPTIDE (CONVERTED LAB): Pro B Natriuretic peptide (BNP): <30

## 2011-06-25 LAB — URINE CULTURE

## 2011-06-25 LAB — EXPECTORATED SPUTUM ASSESSMENT W GRAM STAIN, RFLX TO RESP C

## 2011-06-25 LAB — MAGNESIUM: Magnesium: 1.9

## 2011-10-23 ENCOUNTER — Other Ambulatory Visit: Payer: Self-pay | Admitting: Internal Medicine

## 2011-10-23 ENCOUNTER — Ambulatory Visit
Admission: RE | Admit: 2011-10-23 | Discharge: 2011-10-23 | Disposition: A | Discharge status: Home / Self Care | Payer: Medicare Other | Source: Ambulatory Visit | Attending: Internal Medicine | Admitting: Internal Medicine

## 2011-10-23 DIAGNOSIS — Z1231 Encounter for screening mammogram for malignant neoplasm of breast: Secondary | ICD-10-CM

## 2011-10-23 DIAGNOSIS — E782 Mixed hyperlipidemia: Secondary | ICD-10-CM | POA: Diagnosis not present

## 2011-10-23 DIAGNOSIS — M810 Age-related osteoporosis without current pathological fracture: Secondary | ICD-10-CM | POA: Diagnosis not present

## 2011-10-23 DIAGNOSIS — D649 Anemia, unspecified: Secondary | ICD-10-CM | POA: Diagnosis not present

## 2011-10-23 DIAGNOSIS — I10 Essential (primary) hypertension: Secondary | ICD-10-CM | POA: Diagnosis not present

## 2011-10-23 DIAGNOSIS — H6120 Impacted cerumen, unspecified ear: Secondary | ICD-10-CM | POA: Diagnosis not present

## 2011-10-23 DIAGNOSIS — F325 Major depressive disorder, single episode, in full remission: Secondary | ICD-10-CM | POA: Diagnosis not present

## 2011-11-08 DIAGNOSIS — H9190 Unspecified hearing loss, unspecified ear: Secondary | ICD-10-CM | POA: Diagnosis not present

## 2012-01-31 DIAGNOSIS — L608 Other nail disorders: Secondary | ICD-10-CM | POA: Diagnosis not present

## 2012-02-07 DIAGNOSIS — M2040 Other hammer toe(s) (acquired), unspecified foot: Secondary | ICD-10-CM | POA: Diagnosis not present

## 2012-04-16 DIAGNOSIS — I10 Essential (primary) hypertension: Secondary | ICD-10-CM | POA: Diagnosis not present

## 2012-04-16 DIAGNOSIS — M810 Age-related osteoporosis without current pathological fracture: Secondary | ICD-10-CM | POA: Diagnosis not present

## 2012-04-16 DIAGNOSIS — E782 Mixed hyperlipidemia: Secondary | ICD-10-CM | POA: Diagnosis not present

## 2012-04-16 DIAGNOSIS — D649 Anemia, unspecified: Secondary | ICD-10-CM | POA: Diagnosis not present

## 2012-04-16 DIAGNOSIS — F325 Major depressive disorder, single episode, in full remission: Secondary | ICD-10-CM | POA: Diagnosis not present

## 2012-05-27 DIAGNOSIS — M899 Disorder of bone, unspecified: Secondary | ICD-10-CM | POA: Diagnosis not present

## 2012-10-07 ENCOUNTER — Other Ambulatory Visit: Payer: Self-pay | Admitting: Internal Medicine

## 2012-10-07 DIAGNOSIS — Z1231 Encounter for screening mammogram for malignant neoplasm of breast: Secondary | ICD-10-CM

## 2012-10-09 ENCOUNTER — Encounter (HOSPITAL_COMMUNITY): Payer: Self-pay

## 2012-10-09 ENCOUNTER — Inpatient Hospital Stay (HOSPITAL_COMMUNITY)
Admission: EM | Admit: 2012-10-09 | Discharge: 2012-10-11 | DRG: 192 | Disposition: A | Discharge status: Home / Self Care | Payer: Medicare Other | Attending: Internal Medicine | Admitting: Internal Medicine

## 2012-10-09 ENCOUNTER — Emergency Department (HOSPITAL_COMMUNITY): Payer: Medicare Other

## 2012-10-09 DIAGNOSIS — Z79899 Other long term (current) drug therapy: Secondary | ICD-10-CM | POA: Diagnosis not present

## 2012-10-09 DIAGNOSIS — D72829 Elevated white blood cell count, unspecified: Secondary | ICD-10-CM | POA: Diagnosis not present

## 2012-10-09 DIAGNOSIS — J438 Other emphysema: Secondary | ICD-10-CM | POA: Diagnosis not present

## 2012-10-09 DIAGNOSIS — R0602 Shortness of breath: Secondary | ICD-10-CM | POA: Diagnosis not present

## 2012-10-09 DIAGNOSIS — J45901 Unspecified asthma with (acute) exacerbation: Secondary | ICD-10-CM | POA: Diagnosis present

## 2012-10-09 DIAGNOSIS — T380X5A Adverse effect of glucocorticoids and synthetic analogues, initial encounter: Secondary | ICD-10-CM | POA: Diagnosis not present

## 2012-10-09 DIAGNOSIS — I10 Essential (primary) hypertension: Secondary | ICD-10-CM | POA: Diagnosis not present

## 2012-10-09 DIAGNOSIS — J441 Chronic obstructive pulmonary disease with (acute) exacerbation: Principal | ICD-10-CM | POA: Diagnosis present

## 2012-10-09 HISTORY — DX: Unspecified asthma, uncomplicated: J45.909

## 2012-10-09 HISTORY — DX: Essential (primary) hypertension: I10

## 2012-10-09 LAB — CBC WITH DIFFERENTIAL/PLATELET
Basophils Absolute: 0 10*3/uL (ref 0.0–0.1)
Basophils Relative: 0 % (ref 0–1)
Eosinophils Absolute: 0 10*3/uL (ref 0.0–0.7)
Eosinophils Relative: 0 % (ref 0–5)
HCT: 39.2 % (ref 36.0–46.0)
Hemoglobin: 12.6 g/dL (ref 12.0–15.0)
Lymphocytes Relative: 6 % — ABNORMAL LOW (ref 12–46)
Lymphs Abs: 0.4 10*3/uL — ABNORMAL LOW (ref 0.7–4.0)
MCH: 30.7 pg (ref 26.0–34.0)
MCHC: 32.1 g/dL (ref 30.0–36.0)
MCV: 95.4 fL (ref 78.0–100.0)
Monocytes Absolute: 0.1 10*3/uL (ref 0.1–1.0)
Monocytes Relative: 1 % — ABNORMAL LOW (ref 3–12)
Neutro Abs: 6.5 10*3/uL (ref 1.7–7.7)
Neutrophils Relative %: 94 % — ABNORMAL HIGH (ref 43–77)
Platelets: 296 10*3/uL (ref 150–400)
RBC: 4.11 MIL/uL (ref 3.87–5.11)
RDW: 13.4 % (ref 11.5–15.5)
WBC: 6.9 10*3/uL (ref 4.0–10.5)

## 2012-10-09 LAB — MAGNESIUM: Magnesium: 1.6 mg/dL (ref 1.5–2.5)

## 2012-10-09 LAB — APTT: aPTT: 28 seconds (ref 24–37)

## 2012-10-09 LAB — PROTIME-INR
INR: 1.08 (ref 0.00–1.49)
Prothrombin Time: 13.9 seconds (ref 11.6–15.2)

## 2012-10-09 LAB — COMPREHENSIVE METABOLIC PANEL
ALT: 14 U/L (ref 0–35)
AST: 20 U/L (ref 0–37)
Albumin: 3.8 g/dL (ref 3.5–5.2)
Alkaline Phosphatase: 77 U/L (ref 39–117)
BUN: 8 mg/dL (ref 6–23)
CO2: 24 mEq/L (ref 19–32)
Calcium: 9.4 mg/dL (ref 8.4–10.5)
Chloride: 100 mEq/L (ref 96–112)
Creatinine, Ser: 0.56 mg/dL (ref 0.50–1.10)
GFR calc Af Amer: >90 mL/min (ref >90)
GFR calc non Af Amer: 88 mL/min — ABNORMAL LOW (ref >90)
Glucose, Bld: 171 mg/dL — ABNORMAL HIGH (ref 70–99)
Potassium: 3.2 mEq/L — ABNORMAL LOW (ref 3.5–5.1)
Sodium: 138 mEq/L (ref 135–145)
Total Bilirubin: 0.3 mg/dL (ref 0.3–1.2)
Total Protein: 7.3 g/dL (ref 6.0–8.3)

## 2012-10-09 LAB — BASIC METABOLIC PANEL
BUN: 9 mg/dL (ref 6–23)
Chloride: 102 mEq/L (ref 96–112)
GFR calc Af Amer: >90 mL/min (ref >90)
Glucose, Bld: 159 mg/dL — ABNORMAL HIGH (ref 70–99)
Potassium: 3.6 mEq/L (ref 3.5–5.1)

## 2012-10-09 LAB — CBC
HCT: 40.7 % (ref 36.0–46.0)
Hemoglobin: 13.4 g/dL (ref 12.0–15.0)
RBC: 4.24 MIL/uL (ref 3.87–5.11)
WBC: 6.8 10*3/uL (ref 4.0–10.5)

## 2012-10-09 LAB — ETHANOL: Alcohol, Ethyl (B): <11 mg/dL (ref 0–11)

## 2012-10-09 MED ORDER — ALBUTEROL SULFATE (5 MG/ML) 0.5% IN NEBU
2.5000 mg | INHALATION_SOLUTION | Freq: Four times a day (QID) | RESPIRATORY_TRACT | Status: DC
  Administered 2012-10-09 (×2): 2.5 mg via RESPIRATORY_TRACT
  Filled 2012-10-09 (×2): qty 0.5

## 2012-10-09 MED ORDER — AMLODIPINE BESYLATE 5 MG PO TABS
5.0000 mg | ORAL_TABLET | Freq: Every day | ORAL | Status: DC
Start: 2012-10-09 — End: 2012-10-11
  Administered 2012-10-09 – 2012-10-11 (×3): 5 mg via ORAL
  Filled 2012-10-09 (×3): qty 1

## 2012-10-09 MED ORDER — LEVOFLOXACIN IN D5W 750 MG/150ML IV SOLN
750.0000 mg | INTRAVENOUS | Status: DC
  Administered 2012-10-09: 750 mg via INTRAVENOUS
  Filled 2012-10-09 (×2): qty 150

## 2012-10-09 MED ORDER — PREDNISONE 20 MG PO TABS: 60.0000 mg | ORAL_TABLET | Freq: Once | ORAL | Status: DC

## 2012-10-09 MED ORDER — ACETAMINOPHEN 650 MG RE SUPP: 650.0000 mg | Freq: Four times a day (QID) | RECTAL | Status: DC | PRN

## 2012-10-09 MED ORDER — IPRATROPIUM BROMIDE 0.02 % IN SOLN
0.5000 mg | Freq: Four times a day (QID) | RESPIRATORY_TRACT | Status: DC
  Administered 2012-10-09 (×2): 0.5 mg via RESPIRATORY_TRACT
  Filled 2012-10-09 (×2): qty 2.5

## 2012-10-09 MED ORDER — RISEDRONATE SODIUM 5 MG PO TABS: 35.0000 mg | ORAL_TABLET | ORAL | Status: DC

## 2012-10-09 MED ORDER — ONDANSETRON HCL 4 MG PO TABS: 4.0000 mg | ORAL_TABLET | Freq: Four times a day (QID) | ORAL | Status: DC | PRN

## 2012-10-09 MED ORDER — GUAIFENESIN ER 600 MG PO TB12
600.0000 mg | ORAL_TABLET | Freq: Two times a day (BID) | ORAL | Status: DC
  Administered 2012-10-09 – 2012-10-11 (×5): 600 mg via ORAL
  Filled 2012-10-09 (×6): qty 1

## 2012-10-09 MED ORDER — IPRATROPIUM BROMIDE 0.02 % IN SOLN
0.5000 mg | Freq: Once | RESPIRATORY_TRACT | Status: AC
  Administered 2012-10-09: 0.5 mg via RESPIRATORY_TRACT
  Filled 2012-10-09: qty 2.5

## 2012-10-09 MED ORDER — ONDANSETRON HCL 4 MG/2ML IJ SOLN: 4.0000 mg | Freq: Four times a day (QID) | INTRAMUSCULAR | Status: DC | PRN

## 2012-10-09 MED ORDER — ALBUTEROL (5 MG/ML) CONTINUOUS INHALATION SOLN
10.0000 mg/h | INHALATION_SOLUTION | RESPIRATORY_TRACT | Status: AC
  Administered 2012-10-09: 10 mg/h via RESPIRATORY_TRACT

## 2012-10-09 MED ORDER — IPRATROPIUM BROMIDE 0.02 % IN SOLN
0.5000 mg | Freq: Four times a day (QID) | RESPIRATORY_TRACT | Status: DC
  Administered 2012-10-10 – 2012-10-11 (×5): 0.5 mg via RESPIRATORY_TRACT
  Filled 2012-10-09 (×5): qty 2.5

## 2012-10-09 MED ORDER — ALBUTEROL SULFATE (5 MG/ML) 0.5% IN NEBU
2.5000 mg | INHALATION_SOLUTION | RESPIRATORY_TRACT | Status: DC | PRN
  Administered 2012-10-10: 2.5 mg via RESPIRATORY_TRACT
  Filled 2012-10-09: qty 0.5

## 2012-10-09 MED ORDER — IPRATROPIUM BROMIDE 0.02 % IN SOLN
0.5000 mg | RESPIRATORY_TRACT | Status: DC | PRN
  Administered 2012-10-10: 0.5 mg via RESPIRATORY_TRACT
  Filled 2012-10-09: qty 2.5

## 2012-10-09 MED ORDER — ACETAMINOPHEN 325 MG PO TABS: 650.0000 mg | ORAL_TABLET | Freq: Four times a day (QID) | ORAL | Status: DC | PRN

## 2012-10-09 MED ORDER — ALBUTEROL SULFATE (5 MG/ML) 0.5% IN NEBU
2.5000 mg | INHALATION_SOLUTION | Freq: Four times a day (QID) | RESPIRATORY_TRACT | Status: DC
  Administered 2012-10-10 – 2012-10-11 (×5): 2.5 mg via RESPIRATORY_TRACT
  Filled 2012-10-09 (×5): qty 0.5

## 2012-10-09 MED ORDER — FLUOXETINE HCL 40 MG PO CAPS: 40.0000 mg | ORAL_CAPSULE | Freq: Every day | ORAL | Status: DC

## 2012-10-09 MED ORDER — ALBUTEROL SULFATE (5 MG/ML) 0.5% IN NEBU
5.0000 mg | INHALATION_SOLUTION | Freq: Once | RESPIRATORY_TRACT | Status: DC
  Filled 2012-10-09: qty 1

## 2012-10-09 MED ORDER — METHYLPREDNISOLONE SODIUM SUCC 125 MG IJ SOLR
60.0000 mg | Freq: Two times a day (BID) | INTRAMUSCULAR | Status: DC
  Administered 2012-10-09 – 2012-10-11 (×5): 60 mg via INTRAVENOUS
  Filled 2012-10-09 (×7): qty 0.96

## 2012-10-09 MED ORDER — SODIUM CHLORIDE 0.9 % IV SOLN
INTRAVENOUS | Status: AC
  Administered 2012-10-09: 13:00:00 via INTRAVENOUS

## 2012-10-09 MED ORDER — HYDROCODONE-ACETAMINOPHEN 5-325 MG PO TABS: 1.0000 | ORAL_TABLET | ORAL | Status: DC | PRN

## 2012-10-09 MED ORDER — FLUOXETINE HCL 20 MG PO CAPS
40.0000 mg | ORAL_CAPSULE | Freq: Every day | ORAL | Status: DC
  Administered 2012-10-09 – 2012-10-11 (×3): 40 mg via ORAL
  Filled 2012-10-09 (×3): qty 2

## 2012-10-09 NOTE — ED Provider Notes (Signed)
Medical screening examination/treatment/procedure(s) were performed by non-physician practitioner and as supervising physician I was immediately available for consultation/collaboration.   Gwyneth Sprout, MD 10/09/12 1222

## 2012-10-09 NOTE — Progress Notes (Signed)
PHARMACIST - PHYSICIAN COMMUNICATION  CONCERNING: P&T Medication Policy Regarding Oral Bisphosphonates  RECOMMENDATION: Your order for risedronate (Actonel) has been discontinued at this time.  If the patient's post-hospital medical condition warrants safe use of this class of drugs, please resume the pre-hospital regimen upon discharge.  DESCRIPTION:  Alendronate (Fosamax), ibandronate (Boniva), and risedronate (Actonel) can cause severe esophageal erosions in patients who are unable to remain upright at least 30 minutes after taking this medication.   Since brief interruptions in therapy are thought to have minimal impact on bone mineral density, the Pharmacy & Therapeutics Committee has established that bisphosphonate orders should be routinely discontinued during hospitalization.   To override this safety policy and permit administration of Boniva, Fosamax, or Actonel in the hospital, prescribers must write "DO NOT HOLD" in the comments section when placing the order for this class of medications.  Geoffry Paradise, PharmD, BCPS Pager: [phone] 1:10 PM Pharmacy #: [id number]

## 2012-10-09 NOTE — Progress Notes (Signed)
ANTIBIOTIC CONSULT NOTE - INITIAL  Pharmacy Consult for Levaquin Indication: pneumonia  Allergies  Allergen Reactions  . Penicillins     Patient Measurements:     Vital Signs: Temp: 97.6 F (36.4 C) (01/10 1132) Temp src: Oral (01/10 1132) BP: 166/76 mmHg (01/10 1132) Pulse Rate: 102  (01/10 1132) Intake/Output from previous day:   Intake/Output from this shift:    Labs:  [facility] 10/09/12 0648  WBC 6.8  HGB 13.4  PLT 232  LABCREA --  CREATININE 0.57   CrCl is unknown because there is no height on file for the current visit. No results found for this basename: VANCOTROUGH:2,VANCOPEAK:2,VANCORANDOM:2,GENTTROUGH:2,GENTPEAK:2,GENTRANDOM:2,TOBRATROUGH:2,TOBRAPEAK:2,TOBRARND:2,AMIKACINPEAK:2,AMIKACINTROU:2,AMIKACIN:2, in the last 72 hours   Microbiology: No results found for this or any previous visit (from the past 720 hour(s)).  Medical History: Past Medical History  Diagnosis Date  . Asthma   . Hypertension     Medications:  Anti-infectives    None     Assessment:  79 YOF admitted 1/10 w/ SOB. Hx COPD.  Rx to dose levaquin.   Afebrile, WBC wnl, Scr wnl.   Goal of Therapy:  Appropriate dose of Levaquin Resolution of INFX  Plan:   Levaquin 750mg  IV q24h  Monitor labs, vitals, cultures.   Adjust dose as necessary  Gwen Her PharmD  [phone] 10/09/2012 1:19 PM

## 2012-10-09 NOTE — Care Management (Signed)
UR complete 

## 2012-10-09 NOTE — ED Notes (Signed)
Pt ambulated to bathroom with assistance - became short of breath when returning to the room. PA notified. Will ambulate pt with pulse ox

## 2012-10-09 NOTE — ED Notes (Signed)
[id number]<QI> Expected date:10/09/12<BR> Expected time: 5:37 AM<BR> Means of arrival:Ambulance<BR> Comments:<BR> Shortness of breath

## 2012-10-09 NOTE — ED Notes (Signed)
Respiratory at bedside.

## 2012-10-09 NOTE — H&P (Signed)
[facility] History and Physical  Audrey [name]:[id number] DOB: 1933/09/17 DOA: 10/09/2012  Referring physician: ER physician PCP: Dr. Tyson Dense  Chief Complaint: shortness of breath  HPI:  79 year old female with past medical history of asthma, HTN who was admitted for worsening shortness of breath associated with non-productive cough and subjective fever and chills. Patient reports associated wheezing. No associated chest pain, no palpitations. No abdominal pain, no nausea or vomiting. No blood in stool or urine. In ED, patient continued to wheeze despite nebulizer treatments and oxygen. Her O2 saturation remained above 90% but due to persistent shortness of breath [facility] asked to admit for further management. CXR done in ED shows emphysema.  Assessment and Plan:  Principal Problem:  *Asthma with acute exacerbation/ Emphysema, COPD  Patient reports she has never smoked in past and denies second hand smoking however her CXR is consistent with emphysema  Continue nebulizer treatments every 2 hours PRN and scheduled every 6 hours  Start solumedrol 60 mg Q 12 hours IV for today  Levaquin for possible pneumonia  Oxygen support via nasal canula to keep O2 saturation above 90%  Code Status: Full Family Communication: Pt at bedside Disposition Plan: Admit for further evaluation  Manson Passey, MD  [facility] Pager [phone]  If 7PM-7AM, please contact night-coverage [email or web address] Password [email or web address] 10/09/2012, 1:02 PM  Review of Systems:  Constitutional: Negative for fever, chills and malaise/fatigue. Negative for diaphoresis.  HENT: Negative for hearing loss, ear pain, nosebleeds, congestion, sore throat, neck pain, tinnitus and ear discharge.   Eyes: Negative for blurred vision, double vision, photophobia, pain, discharge and redness.  Respiratory: per HPI.   Cardiovascular: Negative for chest pain, palpitations, orthopnea, claudication and leg swelling.  Gastrointestinal:  Negative for nausea, vomiting and abdominal pain. Negative for heartburn, constipation, blood in stool and melena.  Genitourinary: Negative for dysuria, urgency, frequency, hematuria and flank pain.  Musculoskeletal: Negative for myalgias, back pain, joint pain and falls.  Skin: Negative for itching and rash.  Neurological: Negative for dizziness and weakness. Negative for tingling, tremors, sensory change, speech change, focal weakness, loss of consciousness and headaches.  Endo/Heme/Allergies: Negative for environmental allergies and polydipsia. Does not bruise/bleed easily.  Psychiatric/Behavioral: Negative for suicidal ideas. The patient is not nervous/anxious.      Past Medical History  Diagnosis Date  . Asthma   . Hypertension    Past Surgical History  Procedure Date  . Abdominal hysterectomy    Social History:  reports that she has never smoked. She has never used smokeless tobacco. She reports that she drinks alcohol. She reports that she does not use illicit drugs.  Allergies  Allergen Reactions  . Penicillins     Family History:  Family History  Problem Relation Age of Onset  . Breast cancer Mother   . Lung cancer Father      Prior to Admission medications   Medication Sig Start Date End Date Taking? Authorizing Provider  albuterol (PROVENTIL HFA;VENTOLIN HFA) 108 (90 BASE) MCG/ACT inhaler Inhale 2 puffs into the lungs every 6 (six) hours as needed. For shortness of breath.   Yes Historical Provider, MD  amLODipine (NORVASC) 5 MG tablet Take 5 mg by mouth daily.   Yes Historical Provider, MD  FLUoxetine (PROZAC) 40 MG capsule Take 40 mg by mouth daily.   Yes Historical Provider, MD  risedronate (ACTONEL) 35 MG tablet Take 35 mg by mouth every 7 (seven) days. with water on empty stomach, nothing by mouth or lie down  for next 30 minutes.   Yes Historical Provider, MD   Physical Exam: Filed Vitals:   10/09/12 0608 10/09/12 1132  BP: 156/64 166/76  Pulse: 87 102    Temp: 97.6 F (36.4 C) 97.6 F (36.4 C)  TempSrc: Oral Oral  Resp: 17 21  SpO2: 95% 95%    Physical Exam  Constitutional: Appears well-developed and well-nourished. No distress.  HENT: Normocephalic. External right and left ear normal. Oropharynx is clear and moist.  Eyes: Conjunctivae and EOM are normal. PERRLA, no scleral icterus.  Neck: Normal ROM. Neck supple. No JVD. No tracheal deviation. No thyromegaly.  CVS: RRR, S1/S2 +, no murmurs, no gallops, no carotid bruit.  Pulmonary: wheezing in upper lung lobes, rhonchi in upper lung lobes.  Abdominal: Soft. BS +,  no distension, tenderness, rebound or guarding.  Musculoskeletal: Normal range of motion. No edema and no tenderness.  Lymphadenopathy: No lymphadenopathy noted, cervical, inguinal. Neuro: Alert. Normal reflexes, muscle tone coordination. No cranial nerve deficit. Skin: Skin is warm and dry. No rash noted. Not diaphoretic. No erythema. No pallor.  Psychiatric: Normal mood and affect. Behavior, judgment, thought content normal.   Labs on Admission:  Basic Metabolic Panel:  Lab 10/09/12 0981  NA 139  K 3.6  CL 102  CO2 28  GLUCOSE 159*  BUN 9  CREATININE 0.57  CALCIUM 9.5  MG --  PHOS --   CBC:  Lab 10/09/12 0648  WBC 6.8  NEUTROABS --  HGB 13.4  HCT 40.7  MCV 96.0  PLT 232   Radiological Exams on Admission: Dg Chest Portable 1 View 10/09/2012  *IMPRESSION: Hyperexpansion compatible with emphysema.  No acute abnormality.   Original Report Authenticated By: Holley Dexter, M.D.     EKG: Normal sinus rhythm, no ST/T wave changes  Time spent: 75 minutes

## 2012-10-09 NOTE — ED Provider Notes (Signed)
History     CSN: [id number]  Arrival date & time 10/09/12  0601   First MD Initiated Contact with Patient 10/09/12 [phone]      Chief Complaint  Patient presents with  . Shortness of Breath    (Consider location/radiation/quality/duration/timing/severity/associated sxs/prior treatment) HPI The patient presents to the emergency department with shortness of breath and wheezing.  Patient stated this started about 5 days ago when the weather was extremely cold.  Patient states that her home medications did not seem to improve her symptoms.  Patient has a history of emphysema, COPD, and asthma.  Patient denies chest pain, nausea, vomiting, abdominal pain, back pain, dizziness, syncope, fever, or cough.  Patient states that she did not take any other medications prior to arrival.  She states that walking seems to make her condition worse. Past Medical History  Diagnosis Date  . Asthma   . Hypertension     Past Surgical History  Procedure Date  . Abdominal hysterectomy     No family history on file.  History  Substance Use Topics  . Smoking status: Never Smoker   . Smokeless tobacco: Not on file  . Alcohol Use: Yes     Comment: ocassional    OB History    Grav Para Term Preterm Abortions TAB SAB Ect Mult Living                  Review of Systems All other systems negative except as documented in the HPI. All pertinent positives and negatives as reviewed in the HPI. Allergies  Penicillins  Home Medications   Current Outpatient Rx  Name  Route  Sig  Dispense  Refill  . ALBUTEROL SULFATE HFA 108 (90 BASE) MCG/ACT IN AERS   Inhalation   Inhale 2 puffs into the lungs every 6 (six) hours as needed. For shortness of breath.         . AMLODIPINE BESYLATE 5 MG PO TABS   Oral   Take 5 mg by mouth daily.         Marland Kitchen FLUOXETINE HCL 40 MG PO CAPS   Oral   Take 40 mg by mouth daily.         Marland Kitchen RISEDRONATE SODIUM 35 MG PO TABS   Oral   Take 35 mg by mouth every 7 (seven)  days. with water on empty stomach, nothing by mouth or lie down for next 30 minutes.           BP 156/64  Pulse 87  Temp 97.6 F (36.4 C) (Oral)  Resp 17  SpO2 95%  Physical Exam  Nursing note and vitals reviewed. Constitutional: She is oriented to person, place, and time. She appears well-developed and well-nourished.  HENT:  Head: Normocephalic and atraumatic.  Eyes: Pupils are equal, round, and reactive to light.  Neck: Normal range of motion. Neck supple.  Cardiovascular: Normal rate, regular rhythm and normal heart sounds.   Pulmonary/Chest: She is in respiratory distress. She has wheezes. She has no rales.  Neurological: She is alert and oriented to person, place, and time.  Skin: Skin is warm and dry. No rash noted.    ED Course  Procedures (including critical care time)  Labs Reviewed  BASIC METABOLIC PANEL - Abnormal; Notable for the following:    Glucose, Bld 159 (*)     GFR calc non Af Amer 87 (*)     All other components within normal limits  CBC   Dg Chest Portable 1  View  10/09/2012  *RADIOLOGY REPORT*  Clinical Data: Shortness of breath.  Respiratory difficulty.  PORTABLE CHEST - 1 VIEW  Comparison: PA and lateral chest 02/09/2009.  Findings: The chest is hyperexpanded but the lungs are clear.  No pneumothorax or pleural fluid.  Heart size normal.  IMPRESSION: Hyperexpansion compatible with emphysema.  No acute abnormality.   Original Report Authenticated By: Holley Dexter, M.D.    The patient will need admission for hypoxia.  Her pulse ox dropped to 88-89% range when ambulated.  I spoke with the [facility] about this lady's COPD exacerbation.  They will be down to admit the patient to the hospital   MDM  MDM Reviewed: vitals and nursing note Interpretation: labs and x-ray Consults: admitting MD             Carlyle Dolly, PA-C 10/09/12 1042

## 2012-10-09 NOTE — ED Notes (Signed)
Per EMS: Pt c/o of SOB on and off for last week. HX of Asthma. No relief with home meds. EMS found in tripod position with audile wheezing. Unable to get O2 sats d/t nail polish. Feeling better after neb tx. 15 albuterol, 1 atrovent and 125 solumedrol IV. Pt continues to wheeze.

## 2012-10-09 NOTE — Progress Notes (Signed)
Patient complains of getting no rest. She asks that she not be awakened in the night for nebulizer therapy if she is asleep. RT assessment and treatment protocol completed. Orders changed accordingly.

## 2012-10-09 NOTE — ED Notes (Signed)
3e called for report. RN will call back

## 2012-10-09 NOTE — ED Notes (Signed)
saO2 95% on RA while sitting. 89-90% while ambulating

## 2012-10-10 DIAGNOSIS — J45901 Unspecified asthma with (acute) exacerbation: Secondary | ICD-10-CM | POA: Diagnosis not present

## 2012-10-10 DIAGNOSIS — I10 Essential (primary) hypertension: Secondary | ICD-10-CM | POA: Diagnosis not present

## 2012-10-10 LAB — CBC
HCT: 38.2 % (ref 36.0–46.0)
MCH: 30.5 pg (ref 26.0–34.0)
MCV: 95.5 fL (ref 78.0–100.0)
RDW: 13.5 % (ref 11.5–15.5)
WBC: 17.9 10*3/uL — ABNORMAL HIGH (ref 4.0–10.5)

## 2012-10-10 LAB — COMPREHENSIVE METABOLIC PANEL
ALT: 12 U/L (ref 0–35)
AST: 18 U/L (ref 0–37)
Albumin: 3.5 g/dL (ref 3.5–5.2)
Alkaline Phosphatase: 70 U/L (ref 39–117)
Chloride: 104 mEq/L (ref 96–112)
Potassium: 4 mEq/L (ref 3.5–5.1)
Sodium: 138 mEq/L (ref 135–145)
Total Protein: 6.8 g/dL (ref 6.0–8.3)

## 2012-10-10 NOTE — Progress Notes (Signed)
Subjective: Currently comfortable. Reports about an hour of shortness of breath and wheezing.  Objective: Vital signs in last 24 hours: Temp:  [97.5 F (36.4 C)-98.5 F (36.9 C)] 97.5 F (36.4 C) (01/11 0533) Pulse Rate:  [81-106] 81  (01/11 0533) Resp:  [18-21] 18  (01/11 0533) BP: (128-166)/(68-76) 128/75 mmHg (01/11 0533) SpO2:  [94 %-100 %] 94 % (01/11 0754) Weight:  [72.3 kg (159 lb 6.3 oz)-73.1 kg (161 lb 2.5 oz)] 73.1 kg (161 lb 2.5 oz) (01/11 0700) Weight change:  Last BM Date: 10/08/12  Intake/Output from previous day: 01/10 0701 - 01/11 0700 In: 1142 [P.O.:480; I.V.:662] Out: 1800 [Urine:1800] Intake/Output this shift:    General appearance: alert, cooperative and no distress Resp: wheezes diffuse Cardio: regular rate and rhythm, S1, S2 normal, no murmur, click, rub or gallop Extremities: extremities normal, atraumatic, no cyanosis or edema  Lab Results:  Basename 10/10/12 0425 10/09/12 1330  WBC 17.9* 6.9  HGB 12.2 12.6  HCT 38.2 39.2  PLT 300 296   BMET  Basename 10/10/12 0425 10/09/12 1330  NA 138 138  K 4.0 3.2*  CL 104 100  CO2 26 24  GLUCOSE 158* 171*  BUN 12 8  CREATININE 0.73 0.56  CALCIUM 9.6 9.4    Studies/Results: Dg Chest Portable 1 View  10/09/2012  *RADIOLOGY REPORT*  Clinical Data: Shortness of breath.  Respiratory difficulty.  PORTABLE CHEST - 1 VIEW  Comparison: PA and lateral chest 02/09/2009.  Findings: The chest is hyperexpanded but the lungs are clear.  No pneumothorax or pleural fluid.  Heart size normal.  IMPRESSION: Hyperexpansion compatible with emphysema.  No acute abnormality.   Original Report Authenticated By: Holley Dexter, M.D.     Medications:  I have reviewed the patient's current medications. Prior to Admission:  Prescriptions prior to admission  Medication Sig Dispense Refill  . albuterol (PROVENTIL HFA;VENTOLIN HFA) 108 (90 BASE) MCG/ACT inhaler Inhale 2 puffs into the lungs every 6 (six) hours as needed. For  shortness of breath.      Marland Kitchen amLODipine (NORVASC) 5 MG tablet Take 5 mg by mouth daily.      Marland Kitchen FLUoxetine (PROZAC) 40 MG capsule Take 40 mg by mouth daily.      . risedronate (ACTONEL) 35 MG tablet Take 35 mg by mouth every 7 (seven) days. with water on empty stomach, nothing by mouth or lie down for next 30 minutes.       Scheduled:   . albuterol  2.5 mg Nebulization Q6H WA  . amLODipine  5 mg Oral Daily  . FLUoxetine  40 mg Oral Daily  . guaiFENesin  600 mg Oral BID  . ipratropium  0.5 mg Nebulization Q6H WA  . levofloxacin (LEVAQUIN) IV  750 mg Intravenous Q24H  . methylPREDNISolone (SOLU-MEDROL) injection  60 mg Intravenous Q12H   Continuous:  ZOX:[id number], acetaminophen, albuterol, HYDROcodone-acetaminophen, ipratropium, ondansetron (ZOFRAN) IV, ondansetron  Assessment/Plan: 1) asthma with acute exacerbation:  Improving.  Less short of breath than prior. Will stop 02 today.  Continue solumedrol. Stop Levaquin, no sign of infection, exacerbation likely weather related.  Continue nebs prn. Will need to go home on steriods and LABA.  If does well today will dc to home in am. 2) leucocytosis: due to high dose steriods 3) hypertension: controled, continue current anti-hypertensives 4) hyperinflation on cxr: patient is concerned about question of COPD.  Discussed that in acute asthma exacerbation lungs can by hyperinflated. Will need evaluation after exacerbation resolved. 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 5) prophy: SCD's 6) dispo: expect DC in am   LOS: 1 day   WALSH,CATHERINE 10/10/2012, 8:14 AM

## 2012-10-10 NOTE — Progress Notes (Signed)
PT Cancellation Note  Patient Details Name: SAMORA JERNBERG MRN: [id number] DOB: 05-Mar-1933   Cancelled Treatment:    Reason Eval/Treat Not Completed: Other (comment)  Pt was up earlier in the room on her own.  This afternoon, she states she is doing fine with walking and does not need PT.  She will request PT through nursing if she begins to feel unsteady and needs PT   Donnetta Hail 10/10/2012, 1:54 PM

## 2012-10-11 DIAGNOSIS — I10 Essential (primary) hypertension: Secondary | ICD-10-CM | POA: Diagnosis not present

## 2012-10-11 DIAGNOSIS — J45901 Unspecified asthma with (acute) exacerbation: Secondary | ICD-10-CM | POA: Diagnosis not present

## 2012-10-11 LAB — BASIC METABOLIC PANEL
CO2: 24 mEq/L (ref 19–32)
Calcium: 9.4 mg/dL (ref 8.4–10.5)
Creatinine, Ser: 0.7 mg/dL (ref 0.50–1.10)
GFR calc non Af Amer: 81 mL/min — ABNORMAL LOW (ref >90)
Glucose, Bld: 149 mg/dL — ABNORMAL HIGH (ref 70–99)

## 2012-10-11 LAB — CBC
MCH: 30.2 pg (ref 26.0–34.0)
MCHC: 31.5 g/dL (ref 30.0–36.0)
MCV: 95.8 fL (ref 78.0–100.0)
Platelets: 287 10*3/uL (ref 150–400)
RBC: 4.07 MIL/uL (ref 3.87–5.11)

## 2012-10-11 LAB — GLUCOSE, CAPILLARY: Glucose-Capillary: 150 mg/dL — ABNORMAL HIGH (ref 70–99)

## 2012-10-11 MED ORDER — PREDNISONE (PAK) 10 MG PO TABS: ORAL_TABLET | ORAL | Status: DC

## 2012-10-11 MED ORDER — FLUTICASONE-SALMETEROL 250-50 MCG/DOSE IN AEPB
1.0000 | INHALATION_SPRAY | Freq: Two times a day (BID) | RESPIRATORY_TRACT | Status: DC
Start: 2012-10-11 — End: 2013-11-21

## 2012-10-11 NOTE — Discharge Summary (Signed)
Physician Discharge Summary  Patient ID: Audrey [name] MRN: [id number] DOB/AGE: 1933-03-03 79 y.o.  Admit date: 10/09/2012 Discharge date: 10/11/2012  Admission Diagnoses: Asthma exacerbation  Discharge Diagnoses: Asthma exacerbation  Discharged Condition: good  Hospital Course:  1) Asthma exacerbation: 79 year old female with past medical history of asthma, HTN who was admitted for worsening shortness of breath, wheezing and non-productive cough and subjective fever and chills. Treated with solu-medrol, albuterol and atrovent. Initially treated with levoquin for possible infection, but showed no signes of infection on CXR or symptoms so this was discontinued. At time of discharge she is feeling comfortable, is able to get up and perform ADLs without significant dyspnea.  Sats are >90% on room air.  She is discharged on an oral steroid taper, albuterol to be used as needed and a new prescription for Advair.  She understands the importance of taking the prednisone and Advair daily.  She will call if symptoms worsen.  She has an appointment scheduled with her PCP on 10/28/12. 2) Hypertension: blood pressures controled on home regimen, no changes. 3) hyperinflation on CXR: question of COPD.  Lungs can appear hyperinflated with asthma exacerbation.  She should have this study repeated when feeling better. 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 4) leucocytosis: likely due to high dose steroids. Do not suspect infection.   Consults: None  Significant Diagnostic Studies: labs:  Results for orders placed during the hospital encounter of 10/09/12 (from the past 48 hour(s))  ETHANOL     Status: Normal   Collection Time   10/09/12  1:29 PM      Component Value Range Comment   Alcohol, Ethyl (B) <11  0 - 11 mg/dL   COMPREHENSIVE METABOLIC PANEL     Status: Abnormal   Collection Time   10/09/12  1:30 PM      Component Value Range Comment     Sodium 138  135 - 145 mEq/L    Potassium 3.2 (*) 3.5 - 5.1 mEq/L    Chloride 100  96 - 112 mEq/L    CO2 24  19 - 32 mEq/L    Glucose, Bld 171 (*) 70 - 99 mg/dL    BUN 8  6 - 23 mg/dL    Creatinine, Ser 4.09  0.50 - 1.10 mg/dL    Calcium 9.4  8.4 - 81.1 mg/dL    Total Protein 7.3  6.0 - 8.3 g/dL    Albumin 3.8  3.5 - 5.2 g/dL    AST 20  0 - 37 U/L    ALT 14  0 - 35 U/L    Alkaline Phosphatase 77  39 - 117 U/L    Total Bilirubin 0.3  0.3 - 1.2 mg/dL    GFR calc non Af Amer 88 (*) >90 mL/min    GFR calc Af Amer >90  >90 mL/min   MAGNESIUM     Status: Normal   Collection Time   10/09/12  1:30 PM      Component Value Range Comment   Magnesium 1.6  1.5 - 2.5 mg/dL   PHOSPHORUS     Status: Abnormal   Collection Time   10/09/12  1:30 PM      Component Value Range Comment   Phosphorus 1.4 (*) 2.3 - 4.6 mg/dL   APTT     Status: Normal   Collection Time   10/09/12  1:30 PM  Component Value Range Comment   aPTT 28  24 - 37 seconds   CBC WITH DIFFERENTIAL     Status: Abnormal   Collection Time   10/09/12  1:30 PM      Component Value Range Comment   WBC 6.9  4.0 - 10.5 K/uL    RBC 4.11  3.87 - 5.11 MIL/uL    Hemoglobin 12.6  12.0 - 15.0 g/dL    HCT 16.1  09.6 - 04.5 %    MCV 95.4  78.0 - 100.0 fL    MCH 30.7  26.0 - 34.0 pg    MCHC 32.1  30.0 - 36.0 g/dL    RDW 40.9  81.1 - 91.4 %    Platelets 296  150 - 400 K/uL    Neutrophils Relative 94 (*) 43 - 77 %    Neutro Abs 6.5  1.7 - 7.7 K/uL    Lymphocytes Relative 6 (*) 12 - 46 %    Lymphs Abs 0.4 (*) 0.7 - 4.0 K/uL    Monocytes Relative 1 (*) 3 - 12 %    Monocytes Absolute 0.1  0.1 - 1.0 K/uL    Eosinophils Relative 0  0 - 5 %    Eosinophils Absolute 0.0  0.0 - 0.7 K/uL    Basophils Relative 0  0 - 1 %    Basophils Absolute 0.0  0.0 - 0.1 K/uL   PROTIME-INR     Status: Normal   Collection Time   10/09/12  1:30 PM      Component Value Range Comment   Prothrombin Time 13.9  11.6 - 15.2 seconds    INR 1.08  0.00 - 1.49    COMPREHENSIVE METABOLIC PANEL     Status: Abnormal   Collection Time   10/10/12  4:25 AM      Component Value Range Comment   Sodium 138  135 - 145 mEq/L    Potassium 4.0  3.5 - 5.1 mEq/L    Chloride 104  96 - 112 mEq/L    CO2 26  19 - 32 mEq/L    Glucose, Bld 158 (*) 70 - 99 mg/dL    BUN 12  6 - 23 mg/dL    Creatinine, Ser 7.82  0.50 - 1.10 mg/dL    Calcium 9.6  8.4 - 95.6 mg/dL    Total Protein 6.8  6.0 - 8.3 g/dL    Albumin 3.5  3.5 - 5.2 g/dL    AST 18  0 - 37 U/L    ALT 12  0 - 35 U/L    Alkaline Phosphatase 70  39 - 117 U/L    Total Bilirubin 0.2 (*) 0.3 - 1.2 mg/dL    GFR calc non Af Amer 80 (*) >90 mL/min    GFR calc Af Amer >90  >90 mL/min   CBC     Status: Abnormal   Collection Time   10/10/12  4:25 AM      Component Value Range Comment   WBC 17.9 (*) 4.0 - 10.5 K/uL    RBC 4.00  3.87 - 5.11 MIL/uL    Hemoglobin 12.2  12.0 - 15.0 g/dL    HCT 21.3  08.6 - 57.8 %    MCV 95.5  78.0 - 100.0 fL    MCH 30.5  26.0 - 34.0 pg    MCHC 31.9  30.0 - 36.0 g/dL    RDW 46.9  62.9 - 52.8 %    Platelets 300  150 - 400 K/uL   GLUCOSE, CAPILLARY     Status: Abnormal   Collection Time   10/10/12  7:39 AM      Component Value Range Comment   Glucose-Capillary 151 (*) 70 - 99 mg/dL   CBC     Status: Abnormal   Collection Time   10/11/12  4:27 AM      Component Value Range Comment   WBC 20.9 (*) 4.0 - 10.5 K/uL    RBC 4.07  3.87 - 5.11 MIL/uL    Hemoglobin 12.3  12.0 - 15.0 g/dL    HCT 16.1  09.6 - 04.5 %    MCV 95.8  78.0 - 100.0 fL    MCH 30.2  26.0 - 34.0 pg    MCHC 31.5  30.0 - 36.0 g/dL    RDW 40.9  81.1 - 91.4 %    Platelets 287  150 - 400 K/uL   BASIC METABOLIC PANEL     Status: Abnormal   Collection Time   10/11/12  4:27 AM      Component Value Range Comment   Sodium 138  135 - 145 mEq/L    Potassium 4.2  3.5 - 5.1 mEq/L    Chloride 104  96 - 112 mEq/L    CO2 24  19 - 32 mEq/L    Glucose, Bld 149 (*) 70 - 99 mg/dL    BUN 22  6 - 23 mg/dL    Creatinine, Ser 7.82   0.50 - 1.10 mg/dL    Calcium 9.4  8.4 - 95.6 mg/dL    GFR calc non Af Amer 81 (*) >90 mL/min    GFR calc Af Amer >90  >90 mL/min   GLUCOSE, CAPILLARY     Status: Abnormal   Collection Time   10/11/12  7:27 AM      Component Value Range Comment   Glucose-Capillary 150 (*) 70 - 99 mg/dL    and radiology: chest X-ray  Treatments: antibiotics: Levaquin, steroids: solu-medrol and respiratory therapy: albuterol/atropine nebulizer  Discharge Exam: Blood pressure 149/68, pulse 81, temperature 97.7 F (36.5 C), temperature source Oral, resp. rate 16, height 5\' 3"  (1.6 m), weight 77.9 kg (171 lb 11.8 oz), SpO2 100.00%. General appearance: alert, cooperative and no distress Resp: wheezes mild diffuse expiratory wheezes, no respiratory distress Cardio: regular rate and rhythm, S1, S2 normal, no murmur, click, rub or gallop GI: soft, non-tender; bowel sounds normal; no masses,  no organomegaly Extremities: extremities normal, atraumatic, no cyanosis or edema  Disposition: 01-Home or Self Care  Discharge Orders    Future Appointments: Provider: Department: Dept Phone: Center:   10/23/2012 4:50 PM [facility] [phone] [facility]       Medication List     As of 10/11/2012  9:34 AM    TAKE these medications         albuterol 108 (90 BASE) MCG/ACT inhaler   Commonly known as: PROVENTIL HFA;VENTOLIN HFA   Inhale 2 puffs into the lungs every 6 (six) hours as needed. For shortness of breath.      amLODipine 5 MG tablet   Commonly known as: NORVASC   Take 5 mg by mouth daily.      FLUoxetine 40 MG capsule   Commonly known as: PROZAC   Take 40 mg by mouth daily.      Fluticasone-Salmeterol 250-50 MCG/DOSE Aepb   Commonly known as: ADVAIR   Inhale 1 puff into the  lungs 2 (two) times daily before a meal.      predniSONE 10 MG tablet   Commonly known as: STERAPRED UNI-PAK   6 tabs daily for 2 days, 5 tabs for 2 days, 4 tabs for 2 days, 3 tabs for 2  days, 2 tabs for 2 days, 1 tabs for 2 days then stop.      risedronate 35 MG tablet   Commonly known as: ACTONEL   Take 35 mg by mouth every 7 (seven) days. with water on empty stomach, nothing by mouth or lie down for next 30 minutes.           Follow-up Information    Follow up with [facility], MD. In 2 weeks. (If symptoms worsen call office.)    Contact information:   [address] [place] [address] [phone]          Signed: Elby Showers 10/11/2012, 9:34 AM

## 2012-10-21 DIAGNOSIS — I10 Essential (primary) hypertension: Secondary | ICD-10-CM | POA: Diagnosis not present

## 2012-10-21 DIAGNOSIS — Z23 Encounter for immunization: Secondary | ICD-10-CM | POA: Diagnosis not present

## 2012-10-21 DIAGNOSIS — E782 Mixed hyperlipidemia: Secondary | ICD-10-CM | POA: Diagnosis not present

## 2012-10-21 DIAGNOSIS — Z1331 Encounter for screening for depression: Secondary | ICD-10-CM | POA: Diagnosis not present

## 2012-10-21 DIAGNOSIS — Z0000 Encounter for general adult medical examination without abnormal findings: Secondary | ICD-10-CM | POA: Diagnosis not present

## 2012-10-21 DIAGNOSIS — F325 Major depressive disorder, single episode, in full remission: Secondary | ICD-10-CM | POA: Diagnosis not present

## 2012-10-21 DIAGNOSIS — R7301 Impaired fasting glucose: Secondary | ICD-10-CM | POA: Diagnosis not present

## 2012-10-21 DIAGNOSIS — J45909 Unspecified asthma, uncomplicated: Secondary | ICD-10-CM | POA: Diagnosis not present

## 2012-10-23 ENCOUNTER — Ambulatory Visit
Admission: RE | Admit: 2012-10-23 | Discharge: 2012-10-23 | Disposition: A | Discharge status: Home / Self Care | Payer: Medicare Other | Source: Ambulatory Visit | Attending: Internal Medicine | Admitting: Internal Medicine

## 2012-10-23 DIAGNOSIS — Z1231 Encounter for screening mammogram for malignant neoplasm of breast: Secondary | ICD-10-CM | POA: Diagnosis not present

## 2013-02-05 DIAGNOSIS — M2040 Other hammer toe(s) (acquired), unspecified foot: Secondary | ICD-10-CM | POA: Diagnosis not present

## 2013-02-05 DIAGNOSIS — L84 Corns and callosities: Secondary | ICD-10-CM | POA: Diagnosis not present

## 2013-03-10 DIAGNOSIS — Z01818 Encounter for other preprocedural examination: Secondary | ICD-10-CM | POA: Diagnosis not present

## 2013-03-10 DIAGNOSIS — R209 Unspecified disturbances of skin sensation: Secondary | ICD-10-CM | POA: Diagnosis not present

## 2013-03-10 DIAGNOSIS — Z09 Encounter for follow-up examination after completed treatment for conditions other than malignant neoplasm: Secondary | ICD-10-CM | POA: Diagnosis not present

## 2013-03-10 DIAGNOSIS — M2040 Other hammer toe(s) (acquired), unspecified foot: Secondary | ICD-10-CM | POA: Diagnosis not present

## 2013-03-12 DIAGNOSIS — M2040 Other hammer toe(s) (acquired), unspecified foot: Secondary | ICD-10-CM | POA: Diagnosis not present

## 2013-03-15 DIAGNOSIS — M2040 Other hammer toe(s) (acquired), unspecified foot: Secondary | ICD-10-CM | POA: Diagnosis not present

## 2013-04-13 DIAGNOSIS — S92919A Unspecified fracture of unspecified toe(s), initial encounter for closed fracture: Secondary | ICD-10-CM | POA: Diagnosis not present

## 2013-04-21 DIAGNOSIS — J45909 Unspecified asthma, uncomplicated: Secondary | ICD-10-CM | POA: Diagnosis not present

## 2013-04-21 DIAGNOSIS — F325 Major depressive disorder, single episode, in full remission: Secondary | ICD-10-CM | POA: Diagnosis not present

## 2013-04-21 DIAGNOSIS — E782 Mixed hyperlipidemia: Secondary | ICD-10-CM | POA: Diagnosis not present

## 2013-04-21 DIAGNOSIS — I10 Essential (primary) hypertension: Secondary | ICD-10-CM | POA: Diagnosis not present

## 2013-05-20 ENCOUNTER — Emergency Department (HOSPITAL_COMMUNITY)
Admission: EM | Admit: 2013-05-20 | Discharge: 2013-05-20 | Disposition: A | Discharge status: Home / Self Care | Payer: Medicare Other | Attending: Emergency Medicine | Admitting: Emergency Medicine

## 2013-05-20 ENCOUNTER — Encounter (HOSPITAL_COMMUNITY): Payer: Self-pay | Admitting: Emergency Medicine

## 2013-05-20 ENCOUNTER — Emergency Department (HOSPITAL_COMMUNITY): Payer: Medicare Other

## 2013-05-20 DIAGNOSIS — Z79899 Other long term (current) drug therapy: Secondary | ICD-10-CM | POA: Diagnosis not present

## 2013-05-20 DIAGNOSIS — Z880 Allergy status to penicillin: Secondary | ICD-10-CM | POA: Diagnosis not present

## 2013-05-20 DIAGNOSIS — J45901 Unspecified asthma with (acute) exacerbation: Secondary | ICD-10-CM | POA: Diagnosis not present

## 2013-05-20 DIAGNOSIS — J438 Other emphysema: Secondary | ICD-10-CM | POA: Diagnosis not present

## 2013-05-20 DIAGNOSIS — I10 Essential (primary) hypertension: Secondary | ICD-10-CM | POA: Diagnosis not present

## 2013-05-20 DIAGNOSIS — R0602 Shortness of breath: Secondary | ICD-10-CM | POA: Diagnosis not present

## 2013-05-20 LAB — BASIC METABOLIC PANEL WITH GFR
BUN: 16 mg/dL (ref 6–23)
CO2: 28 meq/L (ref 19–32)
Calcium: 9.3 mg/dL (ref 8.4–10.5)
Chloride: 102 meq/L (ref 96–112)
Creatinine, Ser: 0.72 mg/dL (ref 0.50–1.10)
GFR calc Af Amer: >90 mL/min
GFR calc non Af Amer: 80 mL/min — ABNORMAL LOW
Glucose, Bld: 142 mg/dL — ABNORMAL HIGH (ref 70–99)
Potassium: 3.6 meq/L (ref 3.5–5.1)
Sodium: 139 meq/L (ref 135–145)

## 2013-05-20 LAB — CBC WITH DIFFERENTIAL/PLATELET
Eosinophils Absolute: 0.5 10*3/uL (ref 0.0–0.7)
Eosinophils Relative: 7 % — ABNORMAL HIGH (ref 0–5)
HCT: 38.9 % (ref 36.0–46.0)
Hemoglobin: 12.7 g/dL (ref 12.0–15.0)
Lymphs Abs: 2.9 10*3/uL (ref 0.7–4.0)
MCH: 31.1 pg (ref 26.0–34.0)
MCV: 95.1 fL (ref 78.0–100.0)
Monocytes Relative: 8 % (ref 3–12)
RBC: 4.09 MIL/uL (ref 3.87–5.11)

## 2013-05-20 MED ORDER — ALBUTEROL SULFATE (5 MG/ML) 0.5% IN NEBU
5.0000 mg | INHALATION_SOLUTION | Freq: Once | RESPIRATORY_TRACT | Status: AC
  Administered 2013-05-20: 5 mg via RESPIRATORY_TRACT
  Filled 2013-05-20: qty 1

## 2013-05-20 MED ORDER — PREDNISONE 20 MG PO TABS: 60.0000 mg | ORAL_TABLET | Freq: Every day | ORAL | Status: AC

## 2013-05-20 MED ORDER — ALBUTEROL SULFATE HFA 108 (90 BASE) MCG/ACT IN AERS
2.0000 | INHALATION_SPRAY | RESPIRATORY_TRACT | Status: DC | PRN
  Filled 2013-05-20: qty 6.7

## 2013-05-20 MED ORDER — IPRATROPIUM BROMIDE 0.02 % IN SOLN
0.5000 mg | Freq: Once | RESPIRATORY_TRACT | Status: AC
  Administered 2013-05-20: 0.5 mg via RESPIRATORY_TRACT
  Filled 2013-05-20: qty 2.5

## 2013-05-20 MED ORDER — METHYLPREDNISOLONE SODIUM SUCC 125 MG IJ SOLR: 125.0000 mg | Freq: Once | INTRAMUSCULAR | Status: DC

## 2013-05-20 NOTE — ED Provider Notes (Signed)
Date: 05/20/2013  Rate: 79  Rhythm: normal sinus rhythm  QRS Axis: normal  Intervals: normal  ST/T Wave abnormalities: nonspecific ST/T changes  Conduction Disutrbances:none  Narrative Interpretation:   Old EKG Reviewed: no sig changes    Sunnie Nielsen, MD 05/20/13 2342

## 2013-05-20 NOTE — ED Notes (Signed)
Per EMS, pt ran out of her Albuterol at home, c/o ShOB x3 days, wheezing on scene.  Given 10mg  Albuterol, Atrovent and Solu-medrol on scene, still wheezing.  Pt 100% on RA.  Denies further needs

## 2013-05-20 NOTE — ED Provider Notes (Signed)
CSN: [id number]     Arrival date & time 05/20/13  7829 History     First MD Initiated Contact with Patient 05/20/13 [phone]     Chief Complaint  Patient presents with  . Shortness of Breath   (Consider location/radiation/quality/duration/timing/severity/associated sxs/prior Treatment) Patient is a 79 y.o. female presenting with shortness of breath. The history is provided by the patient. No language interpreter was used.  Shortness of Breath Severity:  Moderate Associated symptoms: wheezing   Associated symptoms: no chest pain, no cough, no fever and no sore throat   Associated symptoms comment:  Wheezing and shortness of breath for 3 days, becoming increasingly worse. No fever or chest pain. She reports a history of asthma and that she ran out of her inhaler several days ago. No N, V.    Past Medical History  Diagnosis Date  . Asthma   . Hypertension    Past Surgical History  Procedure Laterality Date  . Abdominal hysterectomy     Family History  Problem Relation Age of Onset  . Breast cancer Mother   . Lung cancer Father    History  Substance Use Topics  . Smoking status: Never Smoker   . Smokeless tobacco: Never Used  . Alcohol Use: Yes     Comment: ocassional   OB History   Grav Para Term Preterm Abortions TAB SAB Ect Mult Living                 Review of Systems  Constitutional: Negative for fever and chills.  HENT: Negative.  Negative for sore throat and rhinorrhea.   Respiratory: Positive for shortness of breath and wheezing. Negative for cough.   Cardiovascular: Negative for chest pain.  Gastrointestinal: Negative.   Musculoskeletal: Negative.   Skin: Negative.   Neurological: Negative.     Allergies  Penicillins  Home Medications   Current Outpatient Rx  Name  Route  Sig  Dispense  Refill  . albuterol (PROVENTIL HFA;VENTOLIN HFA) 108 (90 BASE) MCG/ACT inhaler   Inhalation   Inhale 2 puffs into the lungs every 6 (six) hours as needed. For  shortness of breath.         Marland Kitchen amLODipine (NORVASC) 5 MG tablet   Oral   Take 5 mg by mouth daily.         Marland Kitchen FLUoxetine (PROZAC) 40 MG capsule   Oral   Take 40 mg by mouth daily.         . Fluticasone-Salmeterol (ADVAIR DISKUS) 250-50 MCG/DOSE AEPB   Inhalation   Inhale 1 puff into the lungs 2 (two) times daily before a meal.   60 each   6   . omega-3 acid ethyl esters (LOVAZA) 1 G capsule   Oral   Take 1 g by mouth daily.         . risedronate (ACTONEL) 35 MG tablet   Oral   Take 35 mg by mouth every 7 (seven) days. Thursdays. with water on empty stomach, nothing by mouth or lie down for next 30 minutes.          BP 149/60  Pulse 82  Temp(Src) 97.5 F (36.4 C) (Oral)  Resp 15  SpO2 100% Physical Exam  Constitutional: She appears well-developed and well-nourished.  HENT:  Head: Normocephalic.  Neck: Normal range of motion. Neck supple.  Cardiovascular: Normal rate and regular rhythm.   Pulmonary/Chest: Effort normal. She has wheezes. She has no rales.  **Examined after nebulizers x 2 given.  Abdominal:  Soft. Bowel sounds are normal. There is no tenderness. There is no rebound and no guarding.  Musculoskeletal: Normal range of motion.  Neurological: She is alert. No cranial nerve deficit.  Skin: Skin is warm and dry. No rash noted.  Psychiatric: She has a normal mood and affect.    ED Course   Procedures (including critical care time)  Labs Reviewed  CBC WITH DIFFERENTIAL - Abnormal; Notable for the following:    Eosinophils Relative 7 (*)    All other components within normal limits  BASIC METABOLIC PANEL - Abnormal; Notable for the following:    Glucose, Bld 142 (*)    GFR calc non Af Amer 80 (*)    All other components within normal limits  . Dg Chest Portable 1 View  05/20/2013   *RADIOLOGY REPORT*  Clinical Data: Shortness of breath and difficulty breathing.  PORTABLE CHEST - 1 VIEW  Comparison: 10/09/2012  Findings: Pulmonary hyperinflation  suggesting emphysema. Peribronchial thickening consistent with chronic bronchitis. Normal heart size and pulmonary vascularity.  No focal consolidation in the lungs.  No blunting of costophrenic angles. No pneumothorax.  Mediastinal contours appear intact.  No significant changes since previous study.  IMPRESSION: Emphysematous and chronic bronchitic changes in the lungs.  No evidence of active pulmonary disease.   Original Report Authenticated By: Burman Nieves, M.D.   No diagnosis found. 1. asthma MDM  She is feeling "50%" better after nebulizers x 2. She appears comfortable, NAD, conversing without difficulty with family member at bedside.  Patient feels better after third treatment. Ambulated well and maintained O2 saturation at 96%. Stable for discharge.   Arnoldo Hooker, PA-C 05/20/13 1006

## 2013-05-20 NOTE — ED Provider Notes (Signed)
Medical screening examination/treatment/procedure(s) were conducted as a shared visit with non-physician practitioner(s) and myself.  I personally evaluated the patient during the encounter  SOB/ wheezing/ history of COPD.  On exam has exp wheezing but no sig resp distress. PT informed me right away that she did not want to be admitted.  She was evaluated with CXR, labs and ECG and given breathing treatments, had steroids by EMS PTA.  She improved clinically and was discharged home in stable condition   Sunnie Nielsen, MD 05/20/13 2345

## 2013-05-20 NOTE — ED Notes (Signed)
PA at bedside.

## 2013-06-24 DIAGNOSIS — J45901 Unspecified asthma with (acute) exacerbation: Secondary | ICD-10-CM | POA: Diagnosis not present

## 2013-06-24 DIAGNOSIS — E782 Mixed hyperlipidemia: Secondary | ICD-10-CM | POA: Diagnosis not present

## 2013-06-24 DIAGNOSIS — I10 Essential (primary) hypertension: Secondary | ICD-10-CM | POA: Diagnosis not present

## 2013-09-02 DIAGNOSIS — J45901 Unspecified asthma with (acute) exacerbation: Secondary | ICD-10-CM | POA: Diagnosis not present

## 2013-09-02 DIAGNOSIS — E782 Mixed hyperlipidemia: Secondary | ICD-10-CM | POA: Diagnosis not present

## 2013-09-02 DIAGNOSIS — I10 Essential (primary) hypertension: Secondary | ICD-10-CM | POA: Diagnosis not present

## 2013-11-03 ENCOUNTER — Other Ambulatory Visit: Payer: Self-pay

## 2013-11-03 DIAGNOSIS — Z1231 Encounter for screening mammogram for malignant neoplasm of breast: Secondary | ICD-10-CM

## 2013-11-17 DIAGNOSIS — R0602 Shortness of breath: Secondary | ICD-10-CM | POA: Diagnosis not present

## 2013-11-18 ENCOUNTER — Inpatient Hospital Stay (HOSPITAL_COMMUNITY)
Admission: EM | Admit: 2013-11-18 | Discharge: 2013-11-21 | DRG: 203 | Disposition: A | Discharge status: Home / Self Care | Payer: Medicare Other | Attending: Internal Medicine | Admitting: Internal Medicine

## 2013-11-18 ENCOUNTER — Emergency Department (HOSPITAL_COMMUNITY): Payer: Medicare Other

## 2013-11-18 ENCOUNTER — Encounter (HOSPITAL_COMMUNITY): Payer: Self-pay | Admitting: Emergency Medicine

## 2013-11-18 DIAGNOSIS — D7289 Other specified disorders of white blood cells: Secondary | ICD-10-CM | POA: Diagnosis not present

## 2013-11-18 DIAGNOSIS — F3289 Other specified depressive episodes: Secondary | ICD-10-CM | POA: Diagnosis present

## 2013-11-18 DIAGNOSIS — I10 Essential (primary) hypertension: Secondary | ICD-10-CM | POA: Diagnosis not present

## 2013-11-18 DIAGNOSIS — F329 Major depressive disorder, single episode, unspecified: Secondary | ICD-10-CM | POA: Diagnosis not present

## 2013-11-18 DIAGNOSIS — J45901 Unspecified asthma with (acute) exacerbation: Secondary | ICD-10-CM | POA: Diagnosis not present

## 2013-11-18 DIAGNOSIS — Z79899 Other long term (current) drug therapy: Secondary | ICD-10-CM

## 2013-11-18 DIAGNOSIS — R0602 Shortness of breath: Secondary | ICD-10-CM | POA: Diagnosis not present

## 2013-11-18 DIAGNOSIS — J45902 Unspecified asthma with status asthmaticus: Principal | ICD-10-CM | POA: Diagnosis present

## 2013-11-18 LAB — CBC WITH DIFFERENTIAL/PLATELET
Basophils Absolute: 0.1 10*3/uL (ref 0.0–0.1)
Basophils Relative: 1 % (ref 0–1)
EOS PCT: 5 % (ref 0–5)
Eosinophils Absolute: 0.7 10*3/uL (ref 0.0–0.7)
HCT: 41.1 % (ref 36.0–46.0)
HEMOGLOBIN: 13.4 g/dL (ref 12.0–15.0)
Lymphocytes Relative: 46 % (ref 12–46)
Lymphs Abs: 6.7 10*3/uL — ABNORMAL HIGH (ref 0.7–4.0)
MCH: 31.8 pg (ref 26.0–34.0)
MCHC: 32.6 g/dL (ref 30.0–36.0)
MCV: 97.6 fL (ref 78.0–100.0)
MONOS PCT: 10 % (ref 3–12)
Monocytes Absolute: 1.4 10*3/uL — ABNORMAL HIGH (ref 0.1–1.0)
NEUTROS PCT: 38 % — AB (ref 43–77)
Neutro Abs: 5.5 10*3/uL (ref 1.7–7.7)
PLATELETS: 305 10*3/uL (ref 150–400)
RBC: 4.21 MIL/uL (ref 3.87–5.11)
RDW: 13.7 % (ref 11.5–15.5)
WBC: 14.4 10*3/uL — AB (ref 4.0–10.5)

## 2013-11-18 LAB — I-STAT ARTERIAL BLOOD GAS, ED
Acid-Base Excess: 2 mmol/L (ref 0.0–2.0)
Bicarbonate: 29 mEq/L — ABNORMAL HIGH (ref 20.0–24.0)
O2 Saturation: 100 %
PCO2 ART: 54.2 mmHg — AB (ref 35.0–45.0)
PO2 ART: 264 mmHg — AB (ref 80.0–100.0)
Patient temperature: 98.6
TCO2: 31 mmol/L (ref 0–100)
pH, Arterial: 7.337 — ABNORMAL LOW (ref 7.350–7.450)

## 2013-11-18 LAB — BASIC METABOLIC PANEL
BUN: 9 mg/dL (ref 6–23)
CALCIUM: 9.4 mg/dL (ref 8.4–10.5)
CO2: 27 meq/L (ref 19–32)
Chloride: 102 mEq/L (ref 96–112)
Creatinine, Ser: 0.68 mg/dL (ref 0.50–1.10)
GFR calc Af Amer: >90 mL/min (ref >90)
GFR, EST NON AFRICAN AMERICAN: 81 mL/min — AB (ref >90)
Glucose, Bld: 165 mg/dL — ABNORMAL HIGH (ref 70–99)
POTASSIUM: 3.9 meq/L (ref 3.7–5.3)
SODIUM: 142 meq/L (ref 137–147)

## 2013-11-18 MED ORDER — PREDNISONE 20 MG PO TABS: 40.0000 mg | ORAL_TABLET | Freq: Every day | ORAL | Status: DC

## 2013-11-18 MED ORDER — METHYLPREDNISOLONE SODIUM SUCC 125 MG IJ SOLR
60.0000 mg | Freq: Two times a day (BID) | INTRAMUSCULAR | Status: DC
  Administered 2013-11-19 – 2013-11-20 (×4): 60 mg via INTRAVENOUS
  Filled 2013-11-18 (×7): qty 0.96

## 2013-11-18 MED ORDER — FLUOXETINE HCL 20 MG PO CAPS
40.0000 mg | ORAL_CAPSULE | Freq: Every day | ORAL | Status: DC
  Administered 2013-11-19 – 2013-11-21 (×3): 40 mg via ORAL
  Filled 2013-11-18 (×3): qty 2

## 2013-11-18 MED ORDER — IPRATROPIUM-ALBUTEROL 0.5-2.5 (3) MG/3ML IN SOLN
3.0000 mL | RESPIRATORY_TRACT | Status: DC
  Administered 2013-11-19 – 2013-11-21 (×14): 3 mL via RESPIRATORY_TRACT
  Filled 2013-11-18 (×14): qty 3

## 2013-11-18 MED ORDER — MOMETASONE FURO-FORMOTEROL FUM 100-5 MCG/ACT IN AERO
2.0000 | INHALATION_SPRAY | Freq: Two times a day (BID) | RESPIRATORY_TRACT | Status: DC
  Administered 2013-11-19 – 2013-11-21 (×4): 2 via RESPIRATORY_TRACT
  Filled 2013-11-18: qty 8.8

## 2013-11-18 MED ORDER — AMLODIPINE BESYLATE 5 MG PO TABS
5.0000 mg | ORAL_TABLET | Freq: Every day | ORAL | Status: DC
  Administered 2013-11-19 – 2013-11-21 (×3): 5 mg via ORAL
  Filled 2013-11-18 (×3): qty 1

## 2013-11-18 MED ORDER — ALBUTEROL SULFATE (2.5 MG/3ML) 0.083% IN NEBU: 2.5000 mg | INHALATION_SOLUTION | RESPIRATORY_TRACT | Status: DC | PRN

## 2013-11-18 MED ORDER — ALBUTEROL SULFATE (2.5 MG/3ML) 0.083% IN NEBU: 2.5000 mg | INHALATION_SOLUTION | Freq: Four times a day (QID) | RESPIRATORY_TRACT | Status: DC | PRN

## 2013-11-18 MED ORDER — ALBUTEROL SULFATE HFA 108 (90 BASE) MCG/ACT IN AERS: 2.0000 | INHALATION_SPRAY | Freq: Four times a day (QID) | RESPIRATORY_TRACT | Status: DC | PRN

## 2013-11-18 MED ORDER — ALBUTEROL (5 MG/ML) CONTINUOUS INHALATION SOLN
10.0000 mg/h | INHALATION_SOLUTION | Freq: Once | RESPIRATORY_TRACT | Status: AC
  Administered 2013-11-18: 10 mg/h via RESPIRATORY_TRACT
  Filled 2013-11-18: qty 20

## 2013-11-18 MED ORDER — HEPARIN SODIUM (PORCINE) 5000 UNIT/ML IJ SOLN
5000.0000 [IU] | Freq: Three times a day (TID) | INTRAMUSCULAR | Status: DC
  Administered 2013-11-19 – 2013-11-21 (×7): 5000 [IU] via SUBCUTANEOUS
  Filled 2013-11-18 (×11): qty 1

## 2013-11-18 NOTE — H&P (Signed)
[facility] History and Physical  Audrey [name] VPX:[id number] DOB: May 01, 1933 DOA: 11/18/2013  Referring physician: EDP PCP: Wenda Low, MD   Chief Complaint: Asthma   HPI: Audrey [name] is a 80 y.o. female who presents to the ED with increased SOB over last few days.  Occurs in context of long history of asthma.  Symptoms unrelieved by home neb treatments.  Patient initially on CPAP, got solumedrol and epi, neb treatments.  Patient now improved, on continuous neb and off of CPAP, states breathing is somewhat better.  Review of Systems: Systems reviewed.  As above, otherwise negative  Past Medical History  Diagnosis Date  . Asthma   . Hypertension    Past Surgical History  Procedure Laterality Date  . Abdominal hysterectomy     Social History:  reports that she has never smoked. She has never used smokeless tobacco. She reports that she drinks alcohol. She reports that she does not use illicit drugs.  Allergies  Allergen Reactions  . Penicillins Other (See Comments)    hallucinations    Family History  Problem Relation Age of Onset  . Breast cancer Mother   . Lung cancer Father      Prior to Admission medications   Medication Sig Start Date End Date Taking? Authorizing Provider  albuterol (PROVENTIL HFA;VENTOLIN HFA) 108 (90 BASE) MCG/ACT inhaler Inhale 2 puffs into the lungs every 6 (six) hours as needed. For shortness of breath.    Historical Provider, MD  alendronate (FOSAMAX) 70 MG tablet  08/28/13   Historical Provider, MD  amLODipine (NORVASC) 5 MG tablet Take 5 mg by mouth daily.    Historical Provider, MD  FLUoxetine (PROZAC) 40 MG capsule Take 40 mg by mouth daily.    Historical Provider, MD  Fluticasone-Salmeterol (ADVAIR DISKUS) 250-50 MCG/DOSE AEPB Inhale 1 puff into the lungs 2 (two) times daily before a meal. 10/11/12   Myrtis Ser, MD  omega-3 acid ethyl esters (LOVAZA) 1 G capsule Take 1 g by mouth daily.    Historical Provider, MD   risedronate (ACTONEL) 35 MG tablet Take 35 mg by mouth every 7 (seven) days. Thursdays. with water on empty stomach, nothing by mouth or lie down for next 30 minutes.    Historical Provider, MD   Physical Exam: Filed Vitals:   11/18/13 2100  BP: 137/86  Pulse: 83  Resp: 15    BP 137/86  Pulse 83  Resp 15  SpO2 100%  General Appearance:    Alert, oriented, no distress, appears stated age  Head:    Normocephalic, atraumatic  Eyes:    PERRL, EOMI, sclera non-icteric        Nose:   Nares without drainage or epistaxis. Mucosa, turbinates normal  Throat:   Moist mucous membranes. Oropharynx without erythema or exudate.  Neck:   Supple. No carotid bruits.  No thyromegaly.  No lymphadenopathy.   Back:     No CVA tenderness, no spinal tenderness  Lungs:     Diffuse wheezes.  Chest wall:    No tenderness to palpitation  Heart:    Regular rate and rhythm without murmurs, gallops, rubs  Abdomen:     Soft, non-tender, nondistended, normal bowel sounds, no organomegaly  Genitalia:    deferred  Rectal:    deferred  Extremities:   No clubbing, cyanosis or edema.  Pulses:   2+ and symmetric all extremities  Skin:   Skin color, texture, turgor normal, no rashes or lesions  Lymph nodes:  Cervical, supraclavicular, and axillary nodes normal  Neurologic:   CNII-XII intact. Normal strength, sensation and reflexes      throughout    Labs on Admission:  Basic Metabolic Panel:  Recent Labs Lab 11/18/13 1951  NA 142  K 3.9  CL 102  CO2 27  GLUCOSE 165*  BUN 9  CREATININE 0.68  CALCIUM 9.4   Liver Function Tests: No results found for this basename: AST, ALT, ALKPHOS, BILITOT, PROT, ALBUMIN,  in the last 168 hours No results found for this basename: LIPASE, AMYLASE,  in the last 168 hours No results found for this basename: AMMONIA,  in the last 168 hours CBC:  Recent Labs Lab 11/18/13 1951  WBC 14.4*  NEUTROABS 5.5  HGB 13.4  HCT 41.1  MCV 97.6  PLT 305   Cardiac  Enzymes: No results found for this basename: CKTOTAL, CKMB, CKMBINDEX, TROPONINI,  in the last 168 hours  BNP (last 3 results) No results found for this basename: PROBNP,  in the last 8760 hours CBG: No results found for this basename: GLUCAP,  in the last 168 hours  Radiological Exams on Admission: Dg Chest Portable 1 View  11/18/2013   CLINICAL DATA:  80 year old female shortness of breath. Hypertension. Initial encounter.  EXAM: PORTABLE CHEST - 1 VIEW  COMPARISON:  05/20/2013 and earlier.  FINDINGS: Portable AP upright view at 1956 hrs. Normal cardiac size and mediastinal contours. Visualized tracheal air column is within normal limits. Allowing for portable technique, the lungs are clear. No pneumothorax.  IMPRESSION: No acute cardiopulmonary abnormality.   Electronically Signed   By: Lars Pinks M.D.   On: 11/18/2013 20:20    EKG: Independently reviewed.  Assessment/Plan Principal Problem:   Status asthmaticus   1. Status asthmaticus - admit, nebs per adult wheeze protocol, continue solumedrol 60mg  q12h, patient has improved somewhat with ED treatment and no longer requiring CPAP at this time.    Code Status: Full Code  Family Communication: No family in room Disposition Plan: Admit to inpatient   Time spent: 50 min  GARDNER, JARED M. [facility] Pager [phone]  If 7AM-7PM, please contact the day team taking care of the patient [email or web address] Password [email or web address] 11/18/2013, 9:59 PM

## 2013-11-18 NOTE — ED Provider Notes (Signed)
Patient seen/examined in the Emergency Department in conjunction with Resident Physician Provider Justin Mend Patient reports SOB/asthma Exam : awake/alert, tachypneic, wheezing bilaterally Plan: admit for status asthmaticus   Sharyon Cable, MD 11/18/13 1951

## 2013-11-18 NOTE — Progress Notes (Signed)
Pt arrived to unit at 2330. Alert and orient x4. Pt oriented to room, unit, and staff. Bed in lowest position and call  Bell is within reach. Will continue to monitor.

## 2013-11-18 NOTE — Progress Notes (Signed)
Called and received report from ED nurse.

## 2013-11-18 NOTE — ED Notes (Signed)
Pt. Desires to have cpap removed.

## 2013-11-18 NOTE — ED Notes (Signed)
Pt in via EMS from home, per EMS- called out to patient home due to increased shortness of breath over the last few days, pt with history of asthma and was without relief from home neb treatments, pt with audible wheezing upon EMS arrival with decreased breath sounds in lower lobes, started on breathing treatment and CPAP, given 125 mg solumedrol, 0.3mg  epi, total 10mg  albuterol and 0.5mg  Atrovent, IV started en route 22g in LHand, pt on CPAP upon arrival, RT and MD at bedside. Pt alert.

## 2013-11-19 LAB — BASIC METABOLIC PANEL
BUN: 9 mg/dL (ref 6–23)
CO2: 26 mEq/L (ref 19–32)
Calcium: 9.4 mg/dL (ref 8.4–10.5)
Chloride: 101 mEq/L (ref 96–112)
Creatinine, Ser: 0.65 mg/dL (ref 0.50–1.10)
GFR calc Af Amer: >90 mL/min (ref >90)
GFR calc non Af Amer: 82 mL/min — ABNORMAL LOW (ref >90)
GLUCOSE: 174 mg/dL — AB (ref 70–99)
Potassium: 3.9 mEq/L (ref 3.7–5.3)
Sodium: 141 mEq/L (ref 137–147)

## 2013-11-19 LAB — PATHOLOGIST SMEAR REVIEW

## 2013-11-19 LAB — CBC
HCT: 39.6 % (ref 36.0–46.0)
HEMOGLOBIN: 12.5 g/dL (ref 12.0–15.0)
MCH: 30.7 pg (ref 26.0–34.0)
MCHC: 31.6 g/dL (ref 30.0–36.0)
MCV: 97.3 fL (ref 78.0–100.0)
PLATELETS: 272 10*3/uL (ref 150–400)
RBC: 4.07 MIL/uL (ref 3.87–5.11)
RDW: 13.7 % (ref 11.5–15.5)
WBC: 9.4 10*3/uL (ref 4.0–10.5)

## 2013-11-19 NOTE — Progress Notes (Signed)
Office record reviwed- pt was on symbiocort 80/4.5 mg bid  Not Advair. Audrey [name]

## 2013-11-19 NOTE — Progress Notes (Signed)
Subjective: Pt feel some better   Objective: Vital signs in last 24 hours: Temp:  [97.9 F (36.6 C)-98.3 F (36.8 C)] 98.3 F (36.8 C) (02/20 0440) Pulse Rate:  [79-111] 87 (02/20 0440) Resp:  [12-24] 18 (02/20 0440) BP: (131-167)/(60-101) 159/85 mmHg (02/20 0440) SpO2:  [99 %-100 %] 99 % (02/20 0440) FiO2 (%):  [60 %-100 %] 100 % (02/19 2128) Weight:  [79.153 kg (174 lb 8 oz)] 79.153 kg (174 lb 8 oz) (02/19 2341) Weight change:  Last BM Date: 11/18/13  Intake/Output from previous day:   Intake/Output this shift:    General appearance: alert Resp: wheezes bilaterally Cardio: regular rate and rhythm  Lab Results:  Recent Labs  11/18/13 1951 11/19/13 0453  WBC 14.4* 9.4  HGB 13.4 12.5  HCT 41.1 39.6  PLT 305 272   BMET  Recent Labs  11/18/13 1951 11/19/13 0453  NA 142 141  K 3.9 3.9  CL 102 101  CO2 27 26  GLUCOSE 165* 174*  BUN 9 9  CREATININE 0.68 0.65  CALCIUM 9.4 9.4    Studies/Results: Dg Chest Portable 1 View  11/18/2013   CLINICAL DATA:  80 year old female shortness of breath. Hypertension. Initial encounter.  EXAM: PORTABLE CHEST - 1 VIEW  COMPARISON:  05/20/2013 and earlier.  FINDINGS: Portable AP upright view at 1956 hrs. Normal cardiac size and mediastinal contours. Visualized tracheal air column is within normal limits. Allowing for portable technique, the lungs are clear. No pneumothorax.  IMPRESSION: No acute cardiopulmonary abnormality.   Electronically Signed   By: Lars Pinks M.D.   On: 11/18/2013 20:20    Medications: I have reviewed the patient's current medications.  Assessment/Plan: Asthma exac: CXR and lab ok Unable to afford Advair. Continue Solumedrol today and alb Nebs Switch to po prednisone with taper Try to get Advair sample from office ( she can pick up Monday) HTN: BP med. Depression -prozac  LOS: 1 day   Cutler Sunday 11/19/2013, 7:48 AM

## 2013-11-19 NOTE — Progress Notes (Signed)
Pt BP 165/78. MD paged. No new orders at this time.

## 2013-11-19 NOTE — ED Provider Notes (Signed)
CSN: [id number]     Arrival date & time 11/18/13  1946 History   First MD Initiated Contact with Patient 11/18/13 1949     Chief Complaint  Patient presents with  . Shortness of Breath   HPI Comments: 80 yo F hx of asthma, HTN, presents via EMS for status asthmaticus.  Pt states she has had worsening dyspnea over last 3 days, with associated wheezing, nonproductive cough, generalized chest tightness.  She denies fever, chills, abdominal pain, nausea, vomiting, diarrhea, myalgias, rash, or any other symptoms.  She has tried home inhalers, and has been compliant with her prescription medications, however symptoms continued to worsen.  EMS was called out to home, and on arrival pt in respiratory distress with audible wheezing.  Pt started on CPAP, and given 125 mg solumedrol, 0.3 mg SQ IM epinephrine, Atrovent, and Albuterol nebs.  Pt brought to ED for further evaluation and treatment.  Of note pt has required intubation for asthma in past.    The history is provided by the patient and the EMS personnel. No language interpreter was used.    Past Medical History  Diagnosis Date  . Asthma   . Hypertension    Past Surgical History  Procedure Laterality Date  . Abdominal hysterectomy     Family History  Problem Relation Age of Onset  . Breast cancer Mother   . Lung cancer Father    History  Substance Use Topics  . Smoking status: Never Smoker   . Smokeless tobacco: Never Used  . Alcohol Use: Yes     Comment: ocassional   OB History   Grav Para Term Preterm Abortions TAB SAB Ect Mult Living                 Review of Systems  Constitutional: Negative for fever and chills.  Respiratory: Positive for cough, shortness of breath and wheezing. Negative for stridor.   Cardiovascular: Negative for chest pain, palpitations and leg swelling.  Gastrointestinal: Negative for nausea, vomiting, abdominal pain, diarrhea and constipation.  Musculoskeletal: Negative for myalgias.  Skin: Negative  for rash.  Neurological: Negative for dizziness, weakness, light-headedness, numbness and headaches.  Hematological: Negative for adenopathy. Does not bruise/bleed easily.  All other systems reviewed and are negative.      Allergies  Penicillins  Home Medications  No current outpatient prescriptions on file. BP 165/78  Pulse 89  Temp(Src) 97.9 F (36.6 C) (Oral)  Resp 18  Ht 5\' 3"  (1.6 m)  Wt 174 lb 8 oz (79.153 kg)  BMI 30.92 kg/m2  SpO2 99% Physical Exam  Nursing note and vitals reviewed. Constitutional: She is oriented to person, place, and time. She appears well-developed and well-nourished.  HENT:  Head: Normocephalic and atraumatic.  Right Ear: External ear normal.  Left Ear: External ear normal.  Nose: Nose normal.  Mouth/Throat: Oropharynx is clear and moist.  Eyes: Conjunctivae and EOM are normal. Pupils are equal, round, and reactive to light.  Neck: Normal range of motion. Neck supple.  Cardiovascular: Normal rate, regular rhythm, normal heart sounds and intact distal pulses.   Pulmonary/Chest: She is in respiratory distress. She has wheezes. She has no rales. She exhibits no tenderness.  Abdominal: Soft. Bowel sounds are normal. She exhibits no distension and no mass. There is no tenderness. There is no rebound and no guarding.  Musculoskeletal: Normal range of motion.  Neurological: She is oriented to person, place, and time.  Skin: Skin is warm and dry.    ED  Course  Procedures (including critical care time) Labs Review Labs Reviewed  BASIC METABOLIC PANEL - Abnormal; Notable for the following:    Glucose, Bld 165 (*)    GFR calc non Af Amer 81 (*)    All other components within normal limits  CBC WITH DIFFERENTIAL - Abnormal; Notable for the following:    WBC 14.4 (*)    Neutrophils Relative % 38 (*)    Lymphs Abs 6.7 (*)    Monocytes Absolute 1.4 (*)    All other components within normal limits  I-STAT ARTERIAL BLOOD GAS, ED - Abnormal; Notable  for the following:    pH, Arterial 7.337 (*)    pCO2 arterial 54.2 (*)    pO2, Arterial 264.0 (*)    Bicarbonate 29.0 (*)    All other components within normal limits  CBC  BASIC METABOLIC PANEL   Imaging Review Dg Chest Portable 1 View  11/18/2013   CLINICAL DATA:  80 year old female shortness of breath. Hypertension. Initial encounter.  EXAM: PORTABLE CHEST - 1 VIEW  COMPARISON:  05/20/2013 and earlier.  FINDINGS: Portable AP upright view at 1956 hrs. Normal cardiac size and mediastinal contours. Visualized tracheal air column is within normal limits. Allowing for portable technique, the lungs are clear. No pneumothorax.  IMPRESSION: No acute cardiopulmonary abnormality.   Electronically Signed   By: Lars Pinks M.D.   On: 11/18/2013 20:20      MDM   Final diagnoses:  Status asthmaticus   80 yo F hx of asthma, HTN, presents via EMS for status asthmaticus.   Filed Vitals:   11/19/13 0003  BP:   Pulse: 89  Temp:   Resp: 18   Physical exam as above.  Pt in respiratory distress, with bilateral wheezing, decreased air movement.  Pt transitioned from CPAP to BiPAP, and placed on continuous albuterol nebulizer.   CXR demonstrated no acute abnormality.  Labs significant for mild leukocytosis, BMP WNL, and arterial blood gas of 7.337, pCO2 54.2, pO2 264, Bicarb 29.    Pt with significant improvement on BiPAP, and was able to be transitioned off of this, on continuous neb currently.  Medicine consulted, and pt to be admitted to their service for further management of status asthmaticus.  Pt understands and agrees with plan.  Pt's care plan discussed with Dr. Christy Gentles.  Sinda Du, MD     Sinda Du, MD 11/19/13 [phone]

## 2013-11-19 NOTE — Care Management Note (Signed)
    Page 1 of 1   11/22/2013     6:28:17 PM   CARE MANAGEMENT NOTE 11/22/2013  Patient:  Audrey [name], Audrey [name]   Account Number:  [id number]  Date Initiated:  11/19/2013  Documentation initiated by:  Tomi Bamberger  Subjective/Objective Assessment:   dx status asthmaticus  admit- lives with grandson.     Action/Plan:   Anticipated DC Date:  11/21/2013   Anticipated DC Plan:  [place]  CM consult      Choice offered to / List presented to:             Status of service:  Completed, signed off Medicare Important Message given?   (If response is "NO", the following Medicare IM given date fields will be blank) Date Medicare IM given:   Date Additional Medicare IM given:    Discharge Disposition:  HOME/SELF CARE  Per UR Regulation:  Reviewed for med. necessity/level of care/duration of stay  If discussed at [place] of Stay Meetings, dates discussed:    Comments:  11/19/13 [place], BSN [phone] patient lives with Yolanda Bonine, patient states she can not afford to pay $60 for advair with her insurance,  NCM spoke with resp therapist, who stated they use dulera as an alternative,  NCM  called [facility] where patient gets her medications, they stated dulera may cost more than the advair.  Per benefits check, patient has a $100 pharmacy deductible that has to be met , which has not been met, after met the patient will have 20% co payment.

## 2013-11-19 NOTE — Progress Notes (Signed)
Utilization review completed.  

## 2013-11-19 NOTE — ED Provider Notes (Signed)
I have personally seen and examined the patient.  I have discussed the plan of care with the resident.  I have reviewed the documentation on PMH/FH/Soc. History.  I have reviewed the documentation of the resident and agree.   Date: 11/18/2013  Rate: 91  Rhythm: normal sinus rhythm  QRS Axis: normal  Intervals: normal  ST/T Wave abnormalities: nonspecific ST changes  Conduction Disutrbances:none    CRITICAL CARE Performed by: Sharyon Cable Total critical care time: 31 Critical care time was exclusive of separately billable procedures and treating other patients. Critical care was necessary to treat or prevent imminent or life-threatening deterioration. Critical care was time spent personally by me on the following activities: development of treatment plan with patient and/or surrogate as well as nursing, discussions with consultants, evaluation of patient's response to treatment, examination of patient, obtaining history from patient or surrogate, ordering and performing treatments and interventions, ordering and review of laboratory studies, ordering and review of radiographic studies, pulse oximetry and re-evaluation of patient's condition.   Pt seen on arrival. She began to improve in the ED while on bipap, and she was admitted.  Pt stabilized in the ED  Sharyon Cable, MD 11/19/13 1014

## 2013-11-20 NOTE — Progress Notes (Signed)
Subjective: Still cough and wheeze  Objective: Vital signs in last 24 hours: Temp:  [97.5 F (36.4 C)-98.3 F (36.8 C)] 98 F (36.7 C) (02/21 0439) Pulse Rate:  [52-88] 76 (02/21 0439) Resp:  [16] 16 (02/21 0439) BP: (113-147)/(63-83) 113/63 mmHg (02/21 0439) SpO2:  [93 %-100 %] 98 % (02/21 0826) Weight change:  Last BM Date: 11/18/13  Intake/Output from previous day:   Intake/Output this shift:    General appearance: alert and cooperative Resp: wheezes bilaterally Cardio: regular rate and rhythm, S1, S2 normal, no murmur, click, rub or gallop Extremities: extremities normal, atraumatic, no cyanosis or edema  Lab Results:  Recent Labs  11/18/13 1951 11/19/13 0453  WBC 14.4* 9.4  HGB 13.4 12.5  HCT 41.1 39.6  PLT 305 272   BMET  Recent Labs  11/18/13 1951 11/19/13 0453  NA 142 141  K 3.9 3.9  CL 102 101  CO2 27 26  GLUCOSE 165* 174*  BUN 9 9  CREATININE 0.68 0.65  CALCIUM 9.4 9.4    Studies/Results: Dg Chest Portable 1 View  11/18/2013   CLINICAL DATA:  80 year old female shortness of breath. Hypertension. Initial encounter.  EXAM: PORTABLE CHEST - 1 VIEW  COMPARISON:  05/20/2013 and earlier.  FINDINGS: Portable AP upright view at 1956 hrs. Normal cardiac size and mediastinal contours. Visualized tracheal air column is within normal limits. Allowing for portable technique, the lungs are clear. No pneumothorax.  IMPRESSION: No acute cardiopulmonary abnormality.   Electronically Signed   By: Lars Pinks M.D.   On: 11/18/2013 20:20    Medications: I have reviewed the patient's current medications.  Assessment/Plan: Asthma exac: Continue Solumedrol today and alb Nebs, gradual improvement Switch to po prednisone with taper tomorrow HTN: BP med. OK Depression -prozac    LOS: 2 days   Audrey [name] JOSEPH 11/20/2013, 10:13 AM

## 2013-11-21 MED ORDER — PREDNISONE 20 MG PO TABS
40.0000 mg | ORAL_TABLET | Freq: Every day | ORAL | Status: DC
  Administered 2013-11-21: 40 mg via ORAL
  Filled 2013-11-21 (×3): qty 2

## 2013-11-21 MED ORDER — PREDNISONE 20 MG PO TABS: 40.0000 mg | ORAL_TABLET | Freq: Every day | ORAL | Status: DC

## 2013-11-21 MED ORDER — IPRATROPIUM-ALBUTEROL 0.5-2.5 (3) MG/3ML IN SOLN
3.0000 mL | Freq: Four times a day (QID) | RESPIRATORY_TRACT | Status: DC
  Administered 2013-11-21: 3 mL via RESPIRATORY_TRACT
  Filled 2013-11-21: qty 3

## 2013-11-21 MED ORDER — MOMETASONE FURO-FORMOTEROL FUM 100-5 MCG/ACT IN AERO: 2.0000 | INHALATION_SPRAY | Freq: Two times a day (BID) | RESPIRATORY_TRACT | Status: DC

## 2013-11-21 NOTE — Discharge Summary (Signed)
Physician Discharge Summary  Patient ID: Audrey [name] MRN: [id number] DOB/AGE: 78-May-1934 78 y.o.  Admit date: 11/18/2013 Discharge date: 11/21/2013  Admission Diagnoses: Asthma exacerbation Hypertension  Discharge Diagnoses:  Principal Problem:   Asthma exacerbation Hypertension   Discharged Condition: good  Hospital Course: The patient was admitted on February 19 complaining of shortness of breath and wheezing over a few days prior to admission unrelieved by home nebulizer treatments. Chest x-ray was unremarkable. She was admitted and treated with IV steroids, bronchodilators and oxygen. She felt much better within 48 hours with decreased wheezing, shortness of breath. She still has some cough. On the day of discharge she was inflated without difficulty and feeling much better with only slight wheezing.  Consults: None  Significant Diagnostic Studies: labs: Sodium 141, potassium 2.9, chloride 101, bicarbonate 26, BUN 9, creatinine 0.65 and radiology: CXR: normal  Treatments: steroids: solu-medrol and respiratory therapy: O2 and albuterol/atropine nebulizer  Discharge Exam: Blood pressure 138/66, pulse 72, temperature 97.7 F (36.5 C), temperature source Oral, resp. rate 16, height 5\' 3"  (1.6 m), weight 79.153 kg (174 lb 8 oz), SpO2 97.00%. General appearance: alert and cooperative Resp: wheezes bilaterally Cardio: regular rate and rhythm, S1, S2 normal, no murmur, click, rub or gallop  Disposition: 01-Home or Self Care   Future Appointments Provider Department Dept Phone   11/23/2013 10:40 AM [facility]  IMAGING [phone]   Please wear two piece clothing and wear no powder or deodorant. Please arrive 15 minutes early prior to your appointment time.       Medication List    STOP taking these medications       Fluticasone-Salmeterol 250-50 MCG/DOSE Aepb  Commonly known as:  ADVAIR DISKUS      TAKE these medications       albuterol 108  (90 BASE) MCG/ACT inhaler  Commonly known as:  PROVENTIL HFA;VENTOLIN HFA  Inhale 2 puffs into the lungs every 6 (six) hours as needed for shortness of breath.     amLODipine 5 MG tablet  Commonly known as:  NORVASC  Take 5 mg by mouth daily.     FLUoxetine 40 MG capsule  Commonly known as:  PROZAC  Take 40 mg by mouth daily.     mometasone-formoterol 100-5 MCG/ACT Aero  Commonly known as:  DULERA  Inhale 2 puffs into the lungs 2 (two) times daily.     omega-3 acid ethyl esters 1 G capsule  Commonly known as:  LOVAZA  Take 1 g by mouth daily.     predniSONE 20 MG tablet  Commonly known as:  DELTASONE  Take 2 tablets (40 mg total) by mouth daily with breakfast.     risedronate 35 MG tablet  Commonly known as:  ACTONEL  Take 35 mg by mouth every 7 (seven) days. Thursdays. with water on empty stomach, nothing by mouth or lie down for next 30 minutes.           Follow-up Information   Follow up with Wenda Low, MD In 11 days.   Specialty:  Internal Medicine   Contact information:   [address], [address] [place] [address] [phone]       Signed: Irven Shelling 11/21/2013, 10:22 AM

## 2013-11-21 NOTE — Progress Notes (Signed)
Loss IV access in pt, pt due to receive IV solumedrol. Paged and spoke with Philbert Riser, MD on call who stated that if pt is to be started on prednisone today per MD note and be discharged, to not gain IV access and pass along to day shift to bring to attention of rounding MDs.

## 2013-11-21 NOTE — Progress Notes (Signed)
Pt given discharge instructions, PIV out, and prescriptions given.  Denied any other needs.  Pt taken to discharge location via wheelchair.

## 2013-11-21 NOTE — Progress Notes (Signed)
Went over flutter instructions with pt, and had her demonstrate use. Pt performed flutter with excellent effort, and immediately had a very strong, but non-productive cough. RT explained how often to use device, and pt appeared excited to use. RT will continue to monitor.

## 2013-11-23 ENCOUNTER — Ambulatory Visit: Payer: Medicare Other

## 2013-12-03 ENCOUNTER — Ambulatory Visit: Payer: Medicare Other

## 2013-12-08 ENCOUNTER — Ambulatory Visit: Payer: Medicare Other

## 2013-12-14 ENCOUNTER — Ambulatory Visit
Admission: RE | Admit: 2013-12-14 | Discharge: 2013-12-14 | Disposition: A | Discharge status: Home / Self Care | Payer: Medicare Other | Source: Ambulatory Visit

## 2013-12-14 DIAGNOSIS — Z0000 Encounter for general adult medical examination without abnormal findings: Secondary | ICD-10-CM | POA: Diagnosis not present

## 2013-12-14 DIAGNOSIS — I10 Essential (primary) hypertension: Secondary | ICD-10-CM | POA: Diagnosis not present

## 2013-12-14 DIAGNOSIS — Z1231 Encounter for screening mammogram for malignant neoplasm of breast: Secondary | ICD-10-CM

## 2013-12-14 DIAGNOSIS — F325 Major depressive disorder, single episode, in full remission: Secondary | ICD-10-CM | POA: Diagnosis not present

## 2013-12-14 DIAGNOSIS — Z23 Encounter for immunization: Secondary | ICD-10-CM | POA: Diagnosis not present

## 2013-12-14 DIAGNOSIS — Z1331 Encounter for screening for depression: Secondary | ICD-10-CM | POA: Diagnosis not present

## 2013-12-14 DIAGNOSIS — R7309 Other abnormal glucose: Secondary | ICD-10-CM | POA: Diagnosis not present

## 2013-12-14 DIAGNOSIS — E782 Mixed hyperlipidemia: Secondary | ICD-10-CM | POA: Diagnosis not present

## 2013-12-14 DIAGNOSIS — J45909 Unspecified asthma, uncomplicated: Secondary | ICD-10-CM | POA: Diagnosis not present

## 2014-06-09 DIAGNOSIS — J45901 Unspecified asthma with (acute) exacerbation: Secondary | ICD-10-CM | POA: Diagnosis not present

## 2014-06-09 DIAGNOSIS — I10 Essential (primary) hypertension: Secondary | ICD-10-CM | POA: Diagnosis not present

## 2014-06-09 DIAGNOSIS — R21 Rash and other nonspecific skin eruption: Secondary | ICD-10-CM | POA: Diagnosis not present

## 2014-06-09 DIAGNOSIS — Z1331 Encounter for screening for depression: Secondary | ICD-10-CM | POA: Diagnosis not present

## 2014-06-09 DIAGNOSIS — E782 Mixed hyperlipidemia: Secondary | ICD-10-CM | POA: Diagnosis not present

## 2014-06-10 DIAGNOSIS — M2010 Hallux valgus (acquired), unspecified foot: Secondary | ICD-10-CM | POA: Diagnosis not present

## 2014-06-10 DIAGNOSIS — M2040 Other hammer toe(s) (acquired), unspecified foot: Secondary | ICD-10-CM | POA: Diagnosis not present

## 2014-06-10 DIAGNOSIS — M79609 Pain in unspecified limb: Secondary | ICD-10-CM | POA: Diagnosis not present

## 2014-06-10 DIAGNOSIS — L608 Other nail disorders: Secondary | ICD-10-CM | POA: Diagnosis not present

## 2014-06-24 DIAGNOSIS — M79609 Pain in unspecified limb: Secondary | ICD-10-CM | POA: Diagnosis not present

## 2014-06-24 DIAGNOSIS — M2040 Other hammer toe(s) (acquired), unspecified foot: Secondary | ICD-10-CM | POA: Diagnosis not present

## 2014-06-27 DIAGNOSIS — Z01818 Encounter for other preprocedural examination: Secondary | ICD-10-CM | POA: Diagnosis not present

## 2014-07-04 DIAGNOSIS — M2042 Other hammer toe(s) (acquired), left foot: Secondary | ICD-10-CM | POA: Diagnosis not present

## 2014-07-08 DIAGNOSIS — M2042 Other hammer toe(s) (acquired), left foot: Secondary | ICD-10-CM | POA: Diagnosis not present

## 2014-07-08 DIAGNOSIS — Z09 Encounter for follow-up examination after completed treatment for conditions other than malignant neoplasm: Secondary | ICD-10-CM | POA: Diagnosis not present

## 2014-10-05 DIAGNOSIS — Z23 Encounter for immunization: Secondary | ICD-10-CM | POA: Diagnosis not present

## 2014-11-01 DIAGNOSIS — H6120 Impacted cerumen, unspecified ear: Secondary | ICD-10-CM | POA: Diagnosis not present

## 2014-11-01 DIAGNOSIS — M79604 Pain in right leg: Secondary | ICD-10-CM | POA: Diagnosis not present

## 2014-11-16 ENCOUNTER — Other Ambulatory Visit: Payer: Self-pay

## 2014-11-16 DIAGNOSIS — Z1231 Encounter for screening mammogram for malignant neoplasm of breast: Secondary | ICD-10-CM

## 2014-11-16 DIAGNOSIS — H6120 Impacted cerumen, unspecified ear: Secondary | ICD-10-CM | POA: Diagnosis not present

## 2014-12-16 ENCOUNTER — Ambulatory Visit
Admission: RE | Admit: 2014-12-16 | Discharge: 2014-12-16 | Disposition: A | Discharge status: Home / Self Care | Payer: Medicare Other | Source: Ambulatory Visit

## 2014-12-16 DIAGNOSIS — Z1231 Encounter for screening mammogram for malignant neoplasm of breast: Secondary | ICD-10-CM

## 2014-12-27 DIAGNOSIS — K219 Gastro-esophageal reflux disease without esophagitis: Secondary | ICD-10-CM | POA: Diagnosis not present

## 2014-12-27 DIAGNOSIS — R7309 Other abnormal glucose: Secondary | ICD-10-CM | POA: Diagnosis not present

## 2014-12-27 DIAGNOSIS — E780 Pure hypercholesterolemia: Secondary | ICD-10-CM | POA: Diagnosis not present

## 2014-12-27 DIAGNOSIS — D61818 Other pancytopenia: Secondary | ICD-10-CM | POA: Diagnosis not present

## 2014-12-27 DIAGNOSIS — M519 Unspecified thoracic, thoracolumbar and lumbosacral intervertebral disc disorder: Secondary | ICD-10-CM | POA: Diagnosis not present

## 2014-12-27 DIAGNOSIS — I10 Essential (primary) hypertension: Secondary | ICD-10-CM | POA: Diagnosis not present

## 2014-12-27 DIAGNOSIS — Z0000 Encounter for general adult medical examination without abnormal findings: Secondary | ICD-10-CM | POA: Diagnosis not present

## 2014-12-27 DIAGNOSIS — J45909 Unspecified asthma, uncomplicated: Secondary | ICD-10-CM | POA: Diagnosis not present

## 2015-11-03 ENCOUNTER — Emergency Department (HOSPITAL_COMMUNITY)
Admission: EM | Admit: 2015-11-03 | Discharge: 2015-11-03 | Disposition: A | Discharge status: Home / Self Care | Payer: Medicare Other | Attending: Emergency Medicine | Admitting: Emergency Medicine

## 2015-11-03 ENCOUNTER — Emergency Department (HOSPITAL_COMMUNITY): Payer: Medicare Other

## 2015-11-03 ENCOUNTER — Encounter (HOSPITAL_COMMUNITY): Payer: Self-pay | Admitting: Emergency Medicine

## 2015-11-03 DIAGNOSIS — Z7951 Long term (current) use of inhaled steroids: Secondary | ICD-10-CM | POA: Insufficient documentation

## 2015-11-03 DIAGNOSIS — Z880 Allergy status to penicillin: Secondary | ICD-10-CM | POA: Diagnosis not present

## 2015-11-03 DIAGNOSIS — Z79899 Other long term (current) drug therapy: Secondary | ICD-10-CM | POA: Insufficient documentation

## 2015-11-03 DIAGNOSIS — Z7952 Long term (current) use of systemic steroids: Secondary | ICD-10-CM | POA: Insufficient documentation

## 2015-11-03 DIAGNOSIS — I10 Essential (primary) hypertension: Secondary | ICD-10-CM | POA: Insufficient documentation

## 2015-11-03 DIAGNOSIS — J4521 Mild intermittent asthma with (acute) exacerbation: Secondary | ICD-10-CM | POA: Insufficient documentation

## 2015-11-03 DIAGNOSIS — R0602 Shortness of breath: Secondary | ICD-10-CM | POA: Diagnosis present

## 2015-11-03 LAB — CBC WITH DIFFERENTIAL/PLATELET
Basophils Absolute: 0.1 10*3/uL (ref 0.0–0.1)
Basophils Relative: 1 %
Eosinophils Absolute: 0.4 10*3/uL (ref 0.0–0.7)
Eosinophils Relative: 6 %
HEMATOCRIT: 42.8 % (ref 36.0–46.0)
Hemoglobin: 13.7 g/dL (ref 12.0–15.0)
LYMPHS PCT: 37 %
Lymphs Abs: 2.7 10*3/uL (ref 0.7–4.0)
MCH: 31.3 pg (ref 26.0–34.0)
MCHC: 32 g/dL (ref 30.0–36.0)
MCV: 97.7 fL (ref 78.0–100.0)
MONO ABS: 0.6 10*3/uL (ref 0.1–1.0)
Monocytes Relative: 8 %
NEUTROS ABS: 3.5 10*3/uL (ref 1.7–7.7)
NEUTROS PCT: 48 %
Platelets: 275 10*3/uL (ref 150–400)
RBC: 4.38 MIL/uL (ref 3.87–5.11)
RDW: 13.6 % (ref 11.5–15.5)
WBC: 7.3 10*3/uL (ref 4.0–10.5)

## 2015-11-03 LAB — BASIC METABOLIC PANEL
ANION GAP: 12 (ref 5–15)
BUN: 13 mg/dL (ref 6–20)
CO2: 24 mmol/L (ref 22–32)
Calcium: 9.7 mg/dL (ref 8.9–10.3)
Chloride: 104 mmol/L (ref 101–111)
Creatinine, Ser: 0.73 mg/dL (ref 0.44–1.00)
GFR calc Af Amer: >60 mL/min (ref >60)
GFR calc non Af Amer: >60 mL/min (ref >60)
GLUCOSE: 108 mg/dL — AB (ref 65–99)
Potassium: 4 mmol/L (ref 3.5–5.1)
Sodium: 140 mmol/L (ref 135–145)

## 2015-11-03 LAB — I-STAT TROPONIN, ED: Troponin i, poc: 0 ng/mL (ref 0.00–0.08)

## 2015-11-03 MED ORDER — ALBUTEROL SULFATE (2.5 MG/3ML) 0.083% IN NEBU
INHALATION_SOLUTION | RESPIRATORY_TRACT | Status: AC
  Filled 2015-11-03: qty 6

## 2015-11-03 MED ORDER — ALBUTEROL SULFATE (2.5 MG/3ML) 0.083% IN NEBU
5.0000 mg | INHALATION_SOLUTION | Freq: Once | RESPIRATORY_TRACT | Status: AC
  Administered 2015-11-03: 5 mg via RESPIRATORY_TRACT
  Filled 2015-11-03: qty 6

## 2015-11-03 MED ORDER — IPRATROPIUM BROMIDE 0.02 % IN SOLN
0.5000 mg | Freq: Once | RESPIRATORY_TRACT | Status: AC
  Administered 2015-11-03: 0.5 mg via RESPIRATORY_TRACT
  Filled 2015-11-03: qty 2.5

## 2015-11-03 MED ORDER — ALBUTEROL SULFATE (2.5 MG/3ML) 0.083% IN NEBU
5.0000 mg | INHALATION_SOLUTION | Freq: Once | RESPIRATORY_TRACT | Status: AC
  Administered 2015-11-03: 5 mg via RESPIRATORY_TRACT

## 2015-11-03 MED ORDER — ALBUTEROL SULFATE (2.5 MG/3ML) 0.083% IN NEBU
INHALATION_SOLUTION | RESPIRATORY_TRACT | Status: AC
Start: 2015-11-03 — End: 2015-11-03
  Filled 2015-11-03: qty 3

## 2015-11-03 MED ORDER — PREDNISONE 20 MG PO TABS
60.0000 mg | ORAL_TABLET | Freq: Once | ORAL | Status: AC
  Administered 2015-11-03: 60 mg via ORAL
  Filled 2015-11-03: qty 3

## 2015-11-03 MED ORDER — PREDNISONE 20 MG PO TABS: ORAL_TABLET | ORAL | Status: DC

## 2015-11-03 NOTE — ED Provider Notes (Signed)
CSN: [id number]     Arrival date & time 11/03/15  1606 History   First MD Initiated Contact with Patient 11/03/15 1941     Chief Complaint  Patient presents with  . Shortness of Breath  . Asthma     (Consider location/radiation/quality/duration/timing/severity/associated sxs/prior Treatment) Patient is a 82 y.o. female presenting with shortness of breath and asthma. The history is provided by the patient and medical records.  Shortness of Breath Associated symptoms: cough and wheezing   Associated symptoms: no chest pain   Asthma Associated symptoms include coughing. Pertinent negatives include no chest pain.    This is an 82 year old female with history of asthma and hypertension, presenting to the ED for shortness of breath. Patient has a long-standing history of asthma, states she was diagnosed in childhood. She states she has had productive cough with yellow sputum for the past 3 days. Denies any fever or chills. No sick contacts. Patient has been using her home a beat on nebulizer as well as inhaler without significant relief. Patient denies any chest pain. She has no known cardiac history.  Patient has never been a smoker.  Patient was given albuterol nebulizer treatment in triage which she reports helped somewhat.  Patient has had prior admissions for her asthma, last one was about 2 years ago.  No prior intubations.  VSS on arrival.  Past Medical History  Diagnosis Date  . Asthma   . Hypertension    Past Surgical History  Procedure Laterality Date  . Abdominal hysterectomy     Family History  Problem Relation Age of Onset  . Breast cancer Mother   . Lung cancer Father    Social History  Substance Use Topics  . Smoking status: Never Smoker   . Smokeless tobacco: Never Used  . Alcohol Use: Yes     Comment: ocassional   OB History    No data available     Review of Systems  Respiratory: Positive for cough, shortness of breath and wheezing.   Cardiovascular: Negative  for chest pain.  All other systems reviewed and are negative.     Allergies  Penicillins  Home Medications   Prior to Admission medications   Medication Sig Start Date End Date Taking? Authorizing Provider  albuterol (PROVENTIL HFA;VENTOLIN HFA) 108 (90 BASE) MCG/ACT inhaler Inhale 2 puffs into the lungs every 6 (six) hours as needed for shortness of breath.    Yes Historical Provider, MD  amLODipine (NORVASC) 5 MG tablet Take 5 mg by mouth daily.   Yes Historical Provider, MD  DiphenhydrAMINE HCl (ANTI-ITCH SPRAY EX) Apply 1 application topically 2 (two) times daily.   Yes Historical Provider, MD  FLUoxetine (PROZAC) 40 MG capsule Take 40 mg by mouth daily.   Yes Historical Provider, MD  mometasone-formoterol (DULERA) 100-5 MCG/ACT AERO Inhale 2 puffs into the lungs 2 (two) times daily. 11/21/13  Yes Lavone Orn, MD  omega-3 acid ethyl esters (LOVAZA) 1 G capsule Take 1 g by mouth daily.   Yes Historical Provider, MD  risedronate (ACTONEL) 35 MG tablet Take 35 mg by mouth every 7 (seven) days. Thursdays. with water on empty stomach, nothing by mouth or lie down for next 30 minutes.   Yes Historical Provider, MD  predniSONE (DELTASONE) 20 MG tablet Take 2 tablets (40 mg total) by mouth daily with breakfast. 11/21/13   Lavone Orn, MD   BP 150/76 mmHg  Pulse 90  Temp(Src) 98 F (36.7 C) (Oral)  Resp 20  Ht 5'  6" (1.676 m)  Wt 67.132 kg  BMI 23.90 kg/m2  SpO2 98%   Physical Exam  Constitutional: She is oriented to person, place, and time. She appears well-developed and well-nourished. No distress.  HENT:  Head: Normocephalic and atraumatic.  Mouth/Throat: Oropharynx is clear and moist.  Eyes: Conjunctivae and EOM are normal. Pupils are equal, round, and reactive to light.  Neck: Normal range of motion. Neck supple.  Cardiovascular: Normal rate, regular rhythm and normal heart sounds.   Pulmonary/Chest: Effort normal. No respiratory distress. She has wheezes.  Diffuse expiratory  wheezes; no distress; speaking in full sentences without difficulty; O2 sats 98%  Abdominal: Soft. Bowel sounds are normal.  Musculoskeletal: Normal range of motion.  Neurological: She is alert and oriented to person, place, and time.  Skin: Skin is warm and dry. She is not diaphoretic.  Psychiatric: She has a normal mood and affect.  Nursing note and vitals reviewed.   ED Course  Procedures (including critical care time) Labs Review Labs Reviewed  BASIC METABOLIC PANEL - Abnormal; Notable for the following:    Glucose, Bld 108 (*)    All other components within normal limits  CBC WITH DIFFERENTIAL/PLATELET  Randolm Idol, ED    Imaging Review Dg Chest 2 View  11/03/2015  CLINICAL DATA:  Shortness of breath, cough. EXAM: CHEST  2 VIEW COMPARISON:  11/18/2013 FINDINGS: The heart size and mediastinal contours are within normal limits. Both lungs are clear. Chronic bronchial wall thickening identified. The visualized skeletal structures are unremarkable. IMPRESSION: No active cardiopulmonary disease. Electronically Signed   By: Kerby Moors M.D.   On: 11/03/2015 17:13   I have personally reviewed and evaluated these images and lab results as part of my medical decision-making.   EKG Interpretation None      MDM   Final diagnoses:  Asthma with acute exacerbation, mild intermittent   82 year old female here with likely asthma exacerbation. She has a long-standing history of the same. Patient is afebrile, nontoxic. Her vital signs are stable on room air. Labwork is reassuring, chest x-ray is clear.  Patient was treated here in the emergency department with 3 nebulizer treatments and dose of prednisone with resolution of symptoms. Patient has been up, walking around the emergency department asking for her discharge papers multiple times as she is very anxious to leave. Her vital signs remained stable on room air, she has not had any signs of respiratory distress, and remains CP free.  Feel she is appropriate for discharge. Will start on prednisone taper. Will continued nebulizer treatments every 4-6 hours PRN wheezing/SOB, albuterol inhaler for rescue.  FU with PCP.  Discussed plan with patient, he/she acknowledged understanding and agreed with plan of care.  Return precautions given for new or worsening symptoms.  Case discussed with attending physician, Dr. Johnney Killian, who agrees with assessment and plan of care.  Larene Pickett, PA-C 11/03/15 [id number]  Charlesetta Shanks, MD 11/05/15 0000

## 2015-11-03 NOTE — Discharge Instructions (Signed)
Take the prescribed medication as directed-- start tomorrow, you have already had today's dose.  Continue to use your home nebulizer treatments and/or rescue inhaler as needed for recurrent wheezing. Follow-up with your primary care physician. Return to the ED for new or worsening symptoms.

## 2015-11-03 NOTE — ED Notes (Signed)
Pt from home for eval of constant productive cough x3 days and worsening sob, pt states has asthma and has been using home nebs and inhaler with minimal relief. Denies any n/v/d or fevers at this time. Alert and oriented. nad ntoed. Expiratory wheezing noted in triage.

## 2015-11-13 ENCOUNTER — Other Ambulatory Visit: Payer: Self-pay

## 2015-11-13 DIAGNOSIS — Z1231 Encounter for screening mammogram for malignant neoplasm of breast: Secondary | ICD-10-CM

## 2015-11-29 ENCOUNTER — Other Ambulatory Visit: Payer: Self-pay

## 2015-11-29 NOTE — Patient Outreach (Signed)
[facility] [facility]) Care Management  11/29/2015  KATHY ZEPEDA [birth year] [id number]   Referral Date:  11/16/2015 Source:  MD Issue:  Patient needs help with neb solutions medication.  Asthma. Insurance:  [facility] Medicare  Outreach call #1 to patient.  Patient not reached.  RN CM left HIPAA compliant voice message with name and number for call back. RN CM scheduled for next outreach call within one week.   Mariann Laster, RN, BSN, [facility], CCM  [employer] Management Coordinator [phone] Direct [phone] Cell [phone] Office [phone] Fax

## 2015-11-30 ENCOUNTER — Other Ambulatory Visit: Payer: Self-pay

## 2015-11-30 DIAGNOSIS — J4521 Mild intermittent asthma with (acute) exacerbation: Secondary | ICD-10-CM

## 2015-11-30 NOTE — Patient Outreach (Signed)
[facility] [facility]) Care Management  11/30/2015  Audrey [name] 1933/02/28 [id number]   Screening   Referral Date: 11/16/2015 Source: MD Issue: Patient needs help with neb solutions medication. Asthma.  Providers: Primary MD: Dr. Wenda Low -  last appt:  11/20/2015   next appt:  12/08/2015 HH: None  Pharmacy: [facility] [address].  Insurance: [employer] Medicare  Social: Lives in her home alone.   Mobility: Ambulates without any assistive devices.  Falls: none  Pain:  None  Transportation:  Self  Caregiver:  Caregiver/Friend, Glennie Hawk [phone] DME: Cane, nebulizer, home scales,   THN conditions:  Other:  ASTHMA Admissions: 0 ER visits: 1  Asthma attack  11/03/2015  Medications:  Patient taking less than 10 medications  Co-pay cost issues: "sometimes I do" but confirms she has what she needs at this time.  Flu Vaccine: 07/10/2015 PCV13: 12/15/2015 PPSV23: 03/29/2001 -unable to afford Advair. -patient overusing nebulizer medications due to increased asthma attacks.  -patient running out of nebulizer medications early due to over use and pharmacy will not refill before next refill date is due.  Consent: Patient agreed to [facility] services and pharmacy consult referral.   Plan:  Referral Date:  11/16/2015 Screening 11/30/2015 Screening 11/30/2015 Telephonic RN CM start date 11/30/2015 Program: Other: Asthma 11/30/2015  [facility] Telephonic RN CM Referral  [place] Referral  -ED utilization for ASTHMA management  -unable to afford Advair. -patient overusing nebulizer medications due to increased asthma attacks.  -patient running out of nebulizer medications early due to over use and pharmacy will not refill before next refill date is due.  -patient needs education on ASTHMA management and education on proper use of medications.   RN CM notified [place] Management Assistant: agreed to services/case opened. RN CM sent successful outreach letter and  [facility]  Introductory package. RN CM scheduled next contact call within the next 30 days for initial assessment.  RN CM advised to please notify MD of any changes in condition prior to scheduled appt's.   RN CM provided contact name and # [phone] or main office # [phone] and 24-hour nurse line # [phone].  RN CM confirmed patient is aware of 911 services for urgent emergency needs.  Mariann Laster, RN, BSN, [facility], CCM  [employer] Management Coordinator [phone] Direct [phone] Cell [phone] Office [phone] Fax

## 2015-11-30 NOTE — Addendum Note (Signed)
Addended by: Standley Brooking on: 11/30/2015 04:28 PM   Modules accepted: Orders

## 2015-12-14 ENCOUNTER — Ambulatory Visit: Payer: Self-pay

## 2015-12-15 ENCOUNTER — Other Ambulatory Visit: Payer: Self-pay

## 2015-12-15 NOTE — Patient Outreach (Signed)
[facility]) Care Management  12/15/2015  KANAYA DIBONA 1933-09-14 [id number]   Outreach call #1 to patient for telephonic initial assessment.  Patient not reached.  RN CM left HIPAA compliant voice message with name and call back number.  RN CM scheduled for next outreach call within one week.   Mariann Laster, RN, BSN, [facility], CCM  [employer] Management Coordinator [phone] Direct [phone] Cell [phone] Office [phone] Fax

## 2015-12-18 ENCOUNTER — Ambulatory Visit
Admission: RE | Admit: 2015-12-18 | Discharge: 2015-12-18 | Disposition: A | Discharge status: Home / Self Care | Payer: Medicare Other | Source: Ambulatory Visit

## 2015-12-18 DIAGNOSIS — Z1231 Encounter for screening mammogram for malignant neoplasm of breast: Secondary | ICD-10-CM

## 2015-12-19 ENCOUNTER — Ambulatory Visit: Payer: Self-pay

## 2015-12-20 ENCOUNTER — Ambulatory Visit: Payer: Self-pay

## 2015-12-21 ENCOUNTER — Other Ambulatory Visit: Payer: Self-pay

## 2015-12-21 NOTE — Patient Outreach (Signed)
[facility]) Care Management  12/21/2015  ALSHA CHMELIK 07/25/1933 [id number]  Outreach call #2 for initial assessment.   Patient not reached.  RN CM left HIPAA compliant voice message with name and number for call back.  RN CM scheduled for next contact call within one week.   Mariann Laster, RN, BSN, [facility], CCM  [employer] Management Coordinator [phone] Direct [phone] Cell [phone] Office [phone] Fax

## 2015-12-26 ENCOUNTER — Other Ambulatory Visit: Payer: Self-pay

## 2015-12-26 NOTE — Progress Notes (Signed)
This encounter was created in error - please disregard.

## 2015-12-26 NOTE — Patient Outreach (Signed)
[facility]) Care Management  12/26/2015  ALEHA LARIMER 1932/11/07 [id number]   Outreach call #3 to patient for initial assessment.  Patient not reached.  RN CM left HIPAA compliant voice message with name and number for call back.  RN CM mailed unsuccessful outreach letter.   RN CM will review for patient response within 2 weeks.   Mariann Laster, RN, BSN, [facility], CCM  [employer] Management Coordinator [phone] Direct [phone] Cell [phone] Office [phone] Fax

## 2016-01-09 ENCOUNTER — Other Ambulatory Visit: Payer: Self-pay

## 2016-01-09 NOTE — Patient Outreach (Addendum)
[facility]) Care Management  01/09/2016  Audrey [name] 10-31-1932 [id number]   Case Closure   Additional outreach #4 to patient. Patient not reached.    H/o No response received back from unsuccessful outreach letter and several phone attempts.   Intervention/Plan: RN CM left HIPAA compliant voice message with name and number for contact.  RN CM notified patient of case closure via case closure letter.  RN CM notified Dr. Lysle Rubens via case closure letter notification.  RN CM notified [place] Assistant case closed.    Mariann Laster, RN, BSN, [facility], CCM  [employer] Management Coordinator [phone] Direct [phone] Cell [phone] Office [phone] Fax

## 2016-04-14 ENCOUNTER — Encounter (HOSPITAL_COMMUNITY): Payer: Self-pay | Admitting: Emergency Medicine

## 2016-04-14 ENCOUNTER — Emergency Department (HOSPITAL_COMMUNITY)
Admission: EM | Admit: 2016-04-14 | Discharge: 2016-04-14 | Disposition: A | Discharge status: Home / Self Care | Payer: Medicare Other | Attending: Emergency Medicine | Admitting: Emergency Medicine

## 2016-04-14 DIAGNOSIS — M7989 Other specified soft tissue disorders: Secondary | ICD-10-CM | POA: Insufficient documentation

## 2016-04-14 DIAGNOSIS — I10 Essential (primary) hypertension: Secondary | ICD-10-CM | POA: Insufficient documentation

## 2016-04-14 DIAGNOSIS — J4521 Mild intermittent asthma with (acute) exacerbation: Secondary | ICD-10-CM | POA: Insufficient documentation

## 2016-04-14 DIAGNOSIS — Z79899 Other long term (current) drug therapy: Secondary | ICD-10-CM | POA: Insufficient documentation

## 2016-04-14 DIAGNOSIS — R0602 Shortness of breath: Secondary | ICD-10-CM | POA: Diagnosis present

## 2016-04-14 MED ORDER — PREDNISONE 20 MG PO TABS
60.0000 mg | ORAL_TABLET | Freq: Once | ORAL | Status: AC
  Administered 2016-04-14: 60 mg via ORAL
  Filled 2016-04-14: qty 3

## 2016-04-14 MED ORDER — ALBUTEROL SULFATE HFA 108 (90 BASE) MCG/ACT IN AERS
2.0000 | INHALATION_SPRAY | Freq: Once | RESPIRATORY_TRACT | Status: AC
  Administered 2016-04-14: 2 via RESPIRATORY_TRACT
  Filled 2016-04-14: qty 6.7

## 2016-04-14 MED ORDER — AEROCHAMBER PLUS FLO-VU MEDIUM MISC
1.0000 | Freq: Once | Status: AC
  Administered 2016-04-14: 1
  Filled 2016-04-14 (×2): qty 1

## 2016-04-14 MED ORDER — PREDNISONE 10 MG PO TABS: 50.0000 mg | ORAL_TABLET | Freq: Every day | ORAL | Status: DC

## 2016-04-14 MED ORDER — IPRATROPIUM-ALBUTEROL 0.5-2.5 (3) MG/3ML IN SOLN
3.0000 mL | RESPIRATORY_TRACT | Status: AC
  Administered 2016-04-14 (×3): 3 mL via RESPIRATORY_TRACT
  Filled 2016-04-14: qty 9

## 2016-04-14 NOTE — Discharge Instructions (Signed)
Asthma, Adult Asthma is a recurring condition in which the airways tighten and narrow. Asthma can make it difficult to breathe. It can cause coughing, wheezing, and shortness of breath. Asthma episodes, also called asthma attacks, range from minor to life-threatening. Asthma cannot be cured, but medicines and lifestyle changes can help control it. CAUSES Asthma is believed to be caused by inherited (genetic) and environmental factors, but its exact cause is unknown. Asthma may be triggered by allergens, lung infections, or irritants in the air. Asthma triggers are different for each person. Common triggers include:   Animal dander.  Dust mites.  Cockroaches.  Pollen from trees or grass.  Mold.  Smoke.  Air pollutants such as dust, household cleaners, hair sprays, aerosol sprays, paint fumes, strong chemicals, or strong odors.  Cold air, weather changes, and winds (which increase molds and pollens in the air).  Strong emotional expressions such as crying or laughing hard.  Stress.  Certain medicines (such as aspirin) or types of drugs (such as beta-blockers).  Sulfites in foods and drinks. Foods and drinks that may contain sulfites include dried fruit, potato chips, and sparkling grape juice.  Infections or inflammatory conditions such as the flu, a cold, or an inflammation of the nasal membranes (rhinitis).  Gastroesophageal reflux disease (GERD).  Exercise or strenuous activity. SYMPTOMS Symptoms may occur immediately after asthma is triggered or many hours later. Symptoms include:  Wheezing.  Excessive nighttime or early morning coughing.  Frequent or severe coughing with a common cold.  Chest tightness.  Shortness of breath. DIAGNOSIS  The diagnosis of asthma is made by a review of your medical history and a physical exam. Tests may also be performed. These may include:  Lung function studies. These tests show how much air you breathe in and out.  Allergy  tests.  Imaging tests such as X-rays. TREATMENT  Asthma cannot be cured, but it can usually be controlled. Treatment involves identifying and avoiding your asthma triggers. It also involves medicines. There are 2 classes of medicine used for asthma treatment:   Controller medicines. These prevent asthma symptoms from occurring. They are usually taken every day.  Reliever or rescue medicines. These quickly relieve asthma symptoms. They are used as needed and provide short-term relief. Your health care provider will help you create an asthma action plan. An asthma action plan is a written plan for managing and treating your asthma attacks. It includes a list of your asthma triggers and how they may be avoided. It also includes information on when medicines should be taken and when their dosage should be changed. An action plan may also involve the use of a device called a peak flow meter. A peak flow meter measures how well the lungs are working. It helps you monitor your condition. HOME CARE INSTRUCTIONS   Take medicines only as directed by your health care provider. Speak with your health care provider if you have questions about how or when to take the medicines.  Use a peak flow meter as directed by your health care provider. Record and keep track of readings.  Understand and use the action plan to help minimize or stop an asthma attack without needing to seek medical care.  Control your home environment in the following ways to help prevent asthma attacks:  Do not smoke. Avoid being exposed to secondhand smoke.  Change your heating and air conditioning filter regularly.  Limit your use of fireplaces and wood stoves.  Get rid of pests (such as roaches   and mice) and their droppings.  Throw away plants if you see mold on them.  Clean your floors and dust regularly. Use unscented cleaning products.  Try to have someone else vacuum for you regularly. Stay out of rooms while they are  being vacuumed and for a short while afterward. If you vacuum, use a dust mask from a hardware store, a double-layered or microfilter vacuum cleaner bag, or a vacuum cleaner with a HEPA filter.  Replace carpet with wood, tile, or vinyl flooring. Carpet can trap dander and dust.  Use allergy-proof pillows, mattress covers, and box spring covers.  Wash bed sheets and blankets every week in hot water and dry them in a dryer.  Use blankets that are made of polyester or cotton.  Clean bathrooms and kitchens with bleach. If possible, have someone repaint the walls in these rooms with mold-resistant paint. Keep out of the rooms that are being cleaned and painted.  Wash hands frequently. SEEK MEDICAL CARE IF:   You have wheezing, shortness of breath, or a cough even if taking medicine to prevent attacks.  The colored mucus you cough up (sputum) is thicker than usual.  Your sputum changes from clear or white to yellow, green, gray, or bloody.  You have any problems that may be related to the medicines you are taking (such as a rash, itching, swelling, or trouble breathing).  You are using a reliever medicine more than 2-3 times per week.  Your peak flow is still at 50-79% of your personal best after following your action plan for 1 hour.  You have a fever. SEEK IMMEDIATE MEDICAL CARE IF:   You seem to be getting worse and are unresponsive to treatment during an asthma attack.  You are short of breath even at rest.  You get short of breath when doing very little physical activity.  You have difficulty eating, drinking, or talking due to asthma symptoms.  You develop chest pain.  You develop a fast heartbeat.  You have a bluish color to your lips or fingernails.  You are light-headed, dizzy, or faint.  Your peak flow is less than 50% of your personal best.   This information is not intended to replace advice given to you by your health care provider. Make sure you discuss any  questions you have with your health care provider.   Document Released: 09/16/2005 Document Revised: 06/07/2015 Document Reviewed: 04/15/2013 Elsevier Interactive Patient Education 2016 [employer]  

## 2016-04-14 NOTE — ED Provider Notes (Signed)
CSN: [id number]     Arrival date & time 04/14/16  0327 History  By signing my name below, I, Irene Pap, attest that this documentation has been prepared under the direction and in the presence of Leo Grosser, MD. Electronically Signed: Irene Pap, ED Scribe. 04/14/2016. 3:43 AM.    Chief Complaint  Patient presents with  . Shortness of Breath   Patient is a 82 y.o. female presenting with shortness of breath. The history is provided by the patient. No language interpreter was used.  Shortness of Breath Severity:  Moderate Onset quality:  Gradual Duration:  3 days Timing:  Constant Progression:  Worsening Chronicity:  New Context comment:  New home Worsened by:  Exertion Ineffective treatments: breathing treatment. Associated symptoms: wheezing   Associated symptoms: no fever   HPI Comments: Audrey [name] is a 82 y.o. Female with a hx of HTN and asthma brought in by EMS who presents to the Emergency Department complaining of gradually worsening SOB onset 3 days ago. She reports associated wheezing and bilateral lower leg swelling. She reports that her breathing was doing well, but she moved into a new home without air conditioning. She believes that the warmer air in the home is worsening her symptoms. She has albuterol treatments at home but states that her prescription expired. She had one treatment at home with EMS PTA. She denies fever or chills.   Past Medical History  Diagnosis Date  . Asthma   . Hypertension    Past Surgical History  Procedure Laterality Date  . Abdominal hysterectomy     Family History  Problem Relation Age of Onset  . Breast cancer Mother   . Lung cancer Father    Social History  Substance Use Topics  . Smoking status: Never Smoker   . Smokeless tobacco: Never Used  . Alcohol Use: Yes     Comment: ocassional   OB History    No data available     Review of Systems  Constitutional: Negative for fever and chills.  Respiratory:  Positive for shortness of breath and wheezing.   Cardiovascular: Positive for leg swelling.  All other systems reviewed and are negative.  Allergies  Penicillins  Home Medications   Prior to Admission medications   Medication Sig Start Date End Date Taking? Authorizing Provider  albuterol (PROVENTIL HFA;VENTOLIN HFA) 108 (90 BASE) MCG/ACT inhaler Inhale 2 puffs into the lungs every 6 (six) hours as needed for shortness of breath.     Historical Provider, MD  amLODipine (NORVASC) 5 MG tablet Take 5 mg by mouth daily.    Historical Provider, MD  DiphenhydrAMINE HCl (ANTI-ITCH SPRAY EX) Apply 1 application topically 2 (two) times daily.    Historical Provider, MD  FLUoxetine (PROZAC) 40 MG capsule Take 40 mg by mouth daily.    Historical Provider, MD  mometasone-formoterol (DULERA) 100-5 MCG/ACT AERO Inhale 2 puffs into the lungs 2 (two) times daily. 11/21/13   Lavone Orn, MD  omega-3 acid ethyl esters (LOVAZA) 1 G capsule Take 1 g by mouth daily.    Historical Provider, MD  predniSONE (DELTASONE) 20 MG tablet Take 40 mg by mouth daily for 3 days, then 20mg  by mouth daily for 3 days, then 10mg  daily for 3 days 11/03/15   Larene Pickett, PA-C  risedronate (ACTONEL) 35 MG tablet Take 35 mg by mouth every 7 (seven) days. Thursdays. with water on empty stomach, nothing by mouth or lie down for next 30 minutes.    Historical  Provider, MD   BP 132/79 mmHg  Pulse 74  Temp(Src) 97.4 F (36.3 C) (Axillary)  Resp 25  Ht 5\' 3"  (1.6 m)  Wt 158 lb (71.668 kg)  BMI 28.00 kg/m2  SpO2 100% Physical Exam  Constitutional: She is oriented to person, place, and time. She appears well-developed and well-nourished. No distress.  HENT:  Head: Normocephalic.  Eyes: Conjunctivae are normal.  Neck: Neck supple. No tracheal deviation present.  Cardiovascular: Normal rate, regular rhythm, S1 normal and S2 normal.   Pulmonary/Chest: Effort normal. No tachypnea. No respiratory distress. She has wheezes.  No  retractions; Tight expiratory wheezes bilaterally  Abdominal: Soft. She exhibits no distension.  Musculoskeletal:  1+bilateral lower extremity pitting edema  Neurological: She is alert and oriented to person, place, and time.  Skin: Skin is warm and dry.  Psychiatric: She has a normal mood and affect.  Nursing note and vitals reviewed.   ED Course  Procedures (including critical care time) DIAGNOSTIC STUDIES: Oxygen Saturation is 100% on RA, normal by my interpretation.    COORDINATION OF CARE: 3:38 AM-Discussed treatment plan which includes breathing treatments with pt at bedside and pt agreed to plan.    Labs Review Labs Reviewed - No data to display  Imaging Review No results found. I have personally reviewed and evaluated these images and lab results as part of my medical decision-making.   EKG Interpretation None      MDM   Final diagnoses:  Asthma with acute exacerbation, mild intermittent    82 y.o. female presents with shortness of breath increasing since being out of [facility] in a new home and having heat exposure combined with running out of home breathing treatments. No infection symptoms. She has h/o asthma and this is c/w prior flares. Improved with albuterol en route with EMS. 3 duo-nebs administered here, at no point was the patient hypoxic or in respiratory distress. PO prednisone here and plan for burst at home. Supplied MDI and spacer for home breakthrough. Plan to follow up with PCP as needed and return precautions discussed for worsening or new concerning symptoms.   I personally performed the services described in this documentation, which was scribed in my presence. The recorded information has been reviewed and is accurate.        Leo Grosser, MD 04/14/16 [phone]

## 2016-04-14 NOTE — ED Notes (Signed)
Patient called EMS with alert bracelet. Patient ran out of albuterol x 3 days. Patient has had multiple attacks and called EMS. Patient had expiratory wheezing when EMS arrived. Patient was given one treatment at home. Patient still has expiratory and inspiratory wheezing. Patient is on room air.

## 2016-04-14 NOTE — ED Notes (Signed)
Bed: WA04 Expected date:  Expected time:  Means of arrival:  Comments: EMS 58 F [place]

## 2016-04-14 NOTE — ED Notes (Signed)
Patient waiting on son to pick her up

## 2016-09-20 ENCOUNTER — Other Ambulatory Visit: Payer: Self-pay | Admitting: Nurse Practitioner

## 2016-09-20 ENCOUNTER — Ambulatory Visit
Admission: RE | Admit: 2016-09-20 | Discharge: 2016-09-20 | Disposition: A | Discharge status: Home / Self Care | Payer: Medicare Other | Source: Ambulatory Visit | Attending: Nurse Practitioner | Admitting: Nurse Practitioner

## 2016-09-20 DIAGNOSIS — M5441 Lumbago with sciatica, right side: Secondary | ICD-10-CM

## 2016-10-24 ENCOUNTER — Emergency Department (HOSPITAL_COMMUNITY)
Admission: EM | Admit: 2016-10-24 | Discharge: 2016-10-24 | Disposition: A | Discharge status: Home / Self Care | Payer: Medicare Other | Attending: Emergency Medicine | Admitting: Emergency Medicine

## 2016-10-24 ENCOUNTER — Encounter (HOSPITAL_COMMUNITY): Payer: Self-pay | Admitting: *Deleted

## 2016-10-24 ENCOUNTER — Emergency Department (HOSPITAL_COMMUNITY): Payer: Medicare Other

## 2016-10-24 DIAGNOSIS — R072 Precordial pain: Secondary | ICD-10-CM | POA: Diagnosis present

## 2016-10-24 DIAGNOSIS — T18128A Food in esophagus causing other injury, initial encounter: Secondary | ICD-10-CM

## 2016-10-24 DIAGNOSIS — Z79899 Other long term (current) drug therapy: Secondary | ICD-10-CM | POA: Diagnosis not present

## 2016-10-24 DIAGNOSIS — I10 Essential (primary) hypertension: Secondary | ICD-10-CM | POA: Insufficient documentation

## 2016-10-24 DIAGNOSIS — J45909 Unspecified asthma, uncomplicated: Secondary | ICD-10-CM | POA: Diagnosis not present

## 2016-10-24 DIAGNOSIS — K222 Esophageal obstruction: Secondary | ICD-10-CM

## 2016-10-24 LAB — BASIC METABOLIC PANEL
Anion gap: 11 (ref 5–15)
BUN: 16 mg/dL (ref 6–20)
CALCIUM: 9.8 mg/dL (ref 8.9–10.3)
CO2: 26 mmol/L (ref 22–32)
CREATININE: 0.99 mg/dL (ref 0.44–1.00)
Chloride: 102 mmol/L (ref 101–111)
GFR calc non Af Amer: 52 mL/min — ABNORMAL LOW (ref >60)
GLUCOSE: 110 mg/dL — AB (ref 65–99)
Potassium: 3.9 mmol/L (ref 3.5–5.1)
Sodium: 139 mmol/L (ref 135–145)

## 2016-10-24 LAB — CBC
HEMATOCRIT: 45.4 % (ref 36.0–46.0)
Hemoglobin: 14.7 g/dL (ref 12.0–15.0)
MCH: 30.6 pg (ref 26.0–34.0)
MCHC: 32.4 g/dL (ref 30.0–36.0)
MCV: 94.6 fL (ref 78.0–100.0)
Platelets: 273 10*3/uL (ref 150–400)
RBC: 4.8 MIL/uL (ref 3.87–5.11)
RDW: 13.3 % (ref 11.5–15.5)
WBC: 7.1 10*3/uL (ref 4.0–10.5)

## 2016-10-24 LAB — I-STAT TROPONIN, ED: Troponin i, poc: 0 ng/mL (ref 0.00–0.08)

## 2016-10-24 MED ORDER — GLUCAGON HCL RDNA (DIAGNOSTIC) 1 MG IJ SOLR
1.0000 mg | Freq: Once | INTRAMUSCULAR | Status: AC
  Administered 2016-10-24: 1 mg via INTRAVENOUS
  Filled 2016-10-24: qty 1

## 2016-10-24 MED ORDER — LORAZEPAM 2 MG/ML IJ SOLN
1.0000 mg | Freq: Once | INTRAMUSCULAR | Status: AC
  Administered 2016-10-24: 1 mg via INTRAVENOUS
  Filled 2016-10-24: qty 1

## 2016-10-24 NOTE — ED Notes (Signed)
Portable CXR at bedside.

## 2016-10-24 NOTE — Discharge Instructions (Signed)
I think food got stuck in your esophagus.  We gave you medications and I think this food has passed.  Drink plenty of fluids today. I would stick with clear liquid foods today such as soup or other soft foods.  Ensure you chew your food thoroughly before swallowing.  Please follow-up with your primary care physician.

## 2016-10-24 NOTE — ED Triage Notes (Signed)
Patient comes in per [facility] post choking. Patient states she was eating her breakfast and choked on bites of meat. Patient was able to cough up some but felt it was still stuck in her throat. EMS called out. Patient vomited out bits of meat with EMS. Initially pain was 10 and now pain is a 7. EMS v/s 104 HR, 98 RA, 170 bp palpated, 142 fsbs. Hx of htn and anxiety.

## 2016-10-24 NOTE — ED Provider Notes (Signed)
[place] DEPT Provider Note   CSN: [id number] Arrival date & time: 10/24/16  0751     History   Chief Complaint Chief Complaint  Patient presents with  . Chest Pain    HPI Audrey [name] is a 83 y.o. female.  HPI The patient is an 83 year old female with a medical history of asthma ( with history of status asthmaticus requiring hospital admission) and hypertension who presents with substernal chest pain that started this morning. The patient states that she was eating meat for breakfast and choked on this. Following choking on this she felt like something has been stuck in her chest which is caused her discomfort. She describes this as a heavy pressure and feels like something is stuck. She says this pain has been moving down her chest over the morning. The pain does not radiate other locations. She has also been nauseated and had several episodes of nonbloody nonbilious emesis. She has never experienced anything like this before. She has never had a heart attack in the past. She denies shortness of breath. She denies fevers or chills. She denies abdominal pain. She denies diarrhea or constipation. She denies polyuria or dysuria. She has no additional acute complaints or concerns today. Past Medical History:  Diagnosis Date  . Asthma   . Hypertension     Patient Active Problem List   Diagnosis Date Noted  . Status asthmaticus 11/18/2013  . Asthma with acute exacerbation 10/09/2012    Past Surgical History:  Procedure Laterality Date  . ABDOMINAL HYSTERECTOMY      OB History    No data available       Home Medications    Prior to Admission medications   Medication Sig Start Date End Date Taking? Authorizing Provider  albuterol (PROVENTIL HFA;VENTOLIN HFA) 108 (90 BASE) MCG/ACT inhaler Inhale 2 puffs into the lungs every 6 (six) hours as needed for shortness of breath.    Yes Historical Provider, MD  amLODipine (NORVASC) 5 MG tablet Take 5 mg by mouth daily.   Yes  Historical Provider, MD  lovastatin (MEVACOR) 40 MG tablet Take 40 mg by mouth daily. 01/18/16  Yes Historical Provider, MD  omega-3 acid ethyl esters (LOVAZA) 1 G capsule Take 1 g by mouth daily. Reported on 04/14/2016   Yes Historical Provider, MD  predniSONE (DELTASONE) 10 MG tablet Take 5 tablets (50 mg total) by mouth daily. 04/14/16  Yes Leo Grosser, MD  risedronate (ACTONEL) 35 MG tablet Take 35 mg by mouth every 7 (seven) days. Thursdays. with water on empty stomach, nothing by mouth or lie down for next 30 minutes.   Yes Historical Provider, MD  triamcinolone cream (KENALOG) 0.5 % Apply 1 application topically 2 (two) times daily as needed. For itchy skin. 02/02/16  Yes Historical Provider, MD    Family History Family History  Problem Relation Age of Onset  . Breast cancer Mother   . Lung cancer Father     Social History Social History  Substance Use Topics  . Smoking status: Never Smoker  . Smokeless tobacco: Never Used  . Alcohol use Yes     Comment: ocassional     Allergies   Penicillins   Review of Systems Review of Systems  All other systems reviewed and are negative.    Physical Exam Updated Vital Signs BP 163/81   Pulse 82   Temp 97.6 F (36.4 C) (Oral)   Resp 18   Ht 5\' 3"  (1.6 m)   Wt 74.8 kg  SpO2 98%   BMI 29.23 kg/m   Physical Exam  Constitutional: She is oriented to person, place, and time. She appears well-developed and well-nourished.  HENT:  Head: Normocephalic and atraumatic.  Cardiovascular: Normal rate and regular rhythm.  Exam reveals no gallop and no friction rub.   No murmur heard. Pulmonary/Chest: Effort normal and breath sounds normal. No respiratory distress. She has no wheezes.  Abdominal: Soft. Bowel sounds are normal. She exhibits no distension. There is no tenderness.  Musculoskeletal: She exhibits edema.  Bilateral lower extremity edema. No tenderness to palpation over the sternum  Neurological: She is alert and oriented to  person, place, and time.     ED Treatments / Results  Labs (all labs ordered are listed, but only abnormal results are displayed) Labs Reviewed  BASIC METABOLIC PANEL - Abnormal; Notable for the following:       Result Value   Glucose, Bld 110 (*)    GFR calc non Af Amer 52 (*)    All other components within normal limits  CBC  I-STAT TROPOININ, ED    EKG  EKG Interpretation  Date/Time:  Thursday October 24 2016 08:45:17 EST Ventricular Rate:  80 PR Interval:    QRS Duration: 83 QT Interval:  400 QTC Calculation: 462 R Axis:   81 Text Interpretation:  Sinus rhythm Anterior infarct, old Baseline wander in lead(s) V3 Confirmed by DELO  MD, DOUGLAS ([id number]) on 10/24/2016 8:54:34 AM Also confirmed by Stark Jock  MD, DOUGLAS ([id number]), editor [place]  CCT, BEVERLY ([id number])  on 10/24/2016 10:56:09 AM       Radiology Dg Chest Portable 1 View  Result Date: 10/24/2016 CLINICAL DATA:  Choking. EXAM: PORTABLE CHEST 1 VIEW COMPARISON:  11/03/2015 . FINDINGS: Low lung volumes. Mild basilar infiltrate in the left cannot be completely excluded. No pleural effusion or pneumothorax. Cardiomegaly with normal pulmonary vascularity. IMPRESSION: Low lung volumes.  Mild left base infiltrate cannot be excluded . Electronically Signed   By: Marcello Moores  Register   On: 10/24/2016 10:45    Procedures Procedures (including critical care time)  Medications Ordered in ED Medications  glucagon (human recombinant) (GLUCAGEN) injection 1 mg (1 mg Intravenous Given 10/24/16 1003)  LORazepam (ATIVAN) injection 1 mg (1 mg Intravenous Given 10/24/16 1003)     Initial Impression / Assessment and Plan / ED Course  I have reviewed the triage vital signs and the nursing notes.  Pertinent labs & imaging results that were available during my care of the patient were reviewed by me and considered in my medical decision making (see chart for details).    Patient presents with chest pressure and pain after choking on  eating meat this morning. Remainder of review of systems unremarkable. Physical examination unremarkable. Given her age we will do cardiac rule out. However, I'm most concerned that the patient has obstruction of the esophagus by food. We will give the patient glucagon to relax the lower gastroesophageal junction as well as Ativan for further muscle relaxation. We will monitor for passage of obstructing object.EKG shows no acute ST segment elevation. CBC within normal limits. Basic metabolic panel largely unremarkable.I-STAT troponin negative. Chest radiograph shows no evidence of pneumomediastinum. Patient was able to drink a can of coke and hold it down. Patient is symptomatically improved. At this time I think the patient's obstruction has resolved. She is feeling better. She is able to have by mouth intake. She is stable for discharge.  Final Clinical Impressions(s) / ED Diagnoses   Final diagnoses:  Obstruction of esophagus due to food impaction    New Prescriptions New Prescriptions   No medications on file     Ophelia Shoulder, MD 10/24/16 [place], MD 10/24/16 [phone]

## 2016-10-24 NOTE — Discharge Planning (Signed)
Pt up for discharge. EDCM reviewed chart for possible CM needs.  No needs identified or communicated.  

## 2016-11-13 ENCOUNTER — Other Ambulatory Visit: Payer: Self-pay | Admitting: Internal Medicine

## 2016-11-13 DIAGNOSIS — Z1231 Encounter for screening mammogram for malignant neoplasm of breast: Secondary | ICD-10-CM

## 2016-12-19 ENCOUNTER — Ambulatory Visit: Payer: Medicare Other

## 2017-01-09 ENCOUNTER — Ambulatory Visit
Admission: RE | Admit: 2017-01-09 | Discharge: 2017-01-09 | Disposition: A | Discharge status: Home / Self Care | Payer: Medicare Other | Source: Ambulatory Visit | Attending: Internal Medicine | Admitting: Internal Medicine

## 2017-01-09 DIAGNOSIS — Z1231 Encounter for screening mammogram for malignant neoplasm of breast: Secondary | ICD-10-CM

## 2017-01-10 ENCOUNTER — Other Ambulatory Visit: Payer: Self-pay | Admitting: Internal Medicine

## 2017-01-10 DIAGNOSIS — R928 Other abnormal and inconclusive findings on diagnostic imaging of breast: Secondary | ICD-10-CM

## 2017-01-17 ENCOUNTER — Ambulatory Visit
Admission: RE | Admit: 2017-01-17 | Discharge: 2017-01-17 | Disposition: A | Discharge status: Home / Self Care | Payer: Medicare Other | Source: Ambulatory Visit | Attending: Internal Medicine | Admitting: Internal Medicine

## 2017-01-17 DIAGNOSIS — R928 Other abnormal and inconclusive findings on diagnostic imaging of breast: Secondary | ICD-10-CM

## 2017-12-05 ENCOUNTER — Other Ambulatory Visit: Payer: Self-pay | Admitting: Internal Medicine

## 2017-12-05 DIAGNOSIS — Z1231 Encounter for screening mammogram for malignant neoplasm of breast: Secondary | ICD-10-CM

## 2018-01-12 ENCOUNTER — Ambulatory Visit
Admission: RE | Admit: 2018-01-12 | Discharge: 2018-01-12 | Disposition: A | Discharge status: Home / Self Care | Payer: Medicare Other | Source: Ambulatory Visit | Attending: Internal Medicine | Admitting: Internal Medicine

## 2018-01-12 DIAGNOSIS — Z1231 Encounter for screening mammogram for malignant neoplasm of breast: Secondary | ICD-10-CM

## 2018-01-13 ENCOUNTER — Other Ambulatory Visit: Payer: Self-pay | Admitting: Internal Medicine

## 2018-01-13 DIAGNOSIS — R928 Other abnormal and inconclusive findings on diagnostic imaging of breast: Secondary | ICD-10-CM

## 2018-01-21 ENCOUNTER — Ambulatory Visit
Admission: RE | Admit: 2018-01-21 | Discharge: 2018-01-21 | Disposition: A | Discharge status: Home / Self Care | Payer: Medicare Other | Source: Ambulatory Visit | Attending: Internal Medicine | Admitting: Internal Medicine

## 2018-01-21 DIAGNOSIS — R928 Other abnormal and inconclusive findings on diagnostic imaging of breast: Secondary | ICD-10-CM

## 2018-04-16 ENCOUNTER — Emergency Department (HOSPITAL_COMMUNITY)
Admission: EM | Admit: 2018-04-16 | Discharge: 2018-04-16 | Disposition: A | Discharge status: Home / Self Care | Payer: Medicare Other | Attending: Emergency Medicine | Admitting: Emergency Medicine

## 2018-04-16 ENCOUNTER — Emergency Department (HOSPITAL_COMMUNITY): Payer: Medicare Other

## 2018-04-16 ENCOUNTER — Other Ambulatory Visit: Payer: Self-pay

## 2018-04-16 DIAGNOSIS — J45909 Unspecified asthma, uncomplicated: Secondary | ICD-10-CM | POA: Insufficient documentation

## 2018-04-16 DIAGNOSIS — Z79899 Other long term (current) drug therapy: Secondary | ICD-10-CM | POA: Insufficient documentation

## 2018-04-16 DIAGNOSIS — I10 Essential (primary) hypertension: Secondary | ICD-10-CM | POA: Diagnosis not present

## 2018-04-16 DIAGNOSIS — R0602 Shortness of breath: Secondary | ICD-10-CM | POA: Diagnosis present

## 2018-04-16 LAB — CBC WITH DIFFERENTIAL/PLATELET
ABS IMMATURE GRANULOCYTES: 0 10*3/uL (ref 0.0–0.1)
BASOS ABS: 0.1 10*3/uL (ref 0.0–0.1)
Basophils Relative: 1 %
Eosinophils Absolute: 0.2 10*3/uL (ref 0.0–0.7)
Eosinophils Relative: 4 %
HEMATOCRIT: 43.9 % (ref 36.0–46.0)
HEMOGLOBIN: 13.4 g/dL (ref 12.0–15.0)
Immature Granulocytes: 1 %
LYMPHS ABS: 3.8 10*3/uL (ref 0.7–4.0)
LYMPHS PCT: 58 %
MCH: 30 pg (ref 26.0–34.0)
MCHC: 30.5 g/dL (ref 30.0–36.0)
MCV: 98.4 fL (ref 78.0–100.0)
Monocytes Absolute: 0.8 10*3/uL (ref 0.1–1.0)
Monocytes Relative: 13 %
NEUTROS ABS: 1.7 10*3/uL (ref 1.7–7.7)
Neutrophils Relative %: 25 %
Platelets: 202 10*3/uL (ref 150–400)
RBC: 4.46 MIL/uL (ref 3.87–5.11)
RDW: 13.7 % (ref 11.5–15.5)
WBC: 6.6 10*3/uL (ref 4.0–10.5)

## 2018-04-16 LAB — COMPREHENSIVE METABOLIC PANEL
ALBUMIN: 3.9 g/dL (ref 3.5–5.0)
ALT: 12 U/L (ref 0–44)
ANION GAP: 9 (ref 5–15)
AST: 23 U/L (ref 15–41)
Alkaline Phosphatase: 62 U/L (ref 38–126)
BUN: 9 mg/dL (ref 8–23)
CHLORIDE: 106 mmol/L (ref 98–111)
CO2: 25 mmol/L (ref 22–32)
Calcium: 9.1 mg/dL (ref 8.9–10.3)
Creatinine, Ser: 0.87 mg/dL (ref 0.44–1.00)
GFR calc Af Amer: >60 mL/min (ref >60)
GFR calc non Af Amer: 60 mL/min — ABNORMAL LOW (ref >60)
GLUCOSE: 140 mg/dL — AB (ref 70–99)
POTASSIUM: 3.3 mmol/L — AB (ref 3.5–5.1)
SODIUM: 140 mmol/L (ref 135–145)
TOTAL PROTEIN: 7.2 g/dL (ref 6.5–8.1)
Total Bilirubin: 0.8 mg/dL (ref 0.3–1.2)

## 2018-04-16 LAB — TROPONIN I

## 2018-04-16 LAB — BRAIN NATRIURETIC PEPTIDE: B NATRIURETIC PEPTIDE 5: 25.3 pg/mL (ref 0.0–100.0)

## 2018-04-16 MED ORDER — METHYLPREDNISOLONE SODIUM SUCC 125 MG IJ SOLR: 125.0000 mg | Freq: Once | INTRAMUSCULAR | Status: DC

## 2018-04-16 MED ORDER — ALBUTEROL (5 MG/ML) CONTINUOUS INHALATION SOLN
10.0000 mg/h | INHALATION_SOLUTION | RESPIRATORY_TRACT | Status: DC
Start: 2018-04-16 — End: 2018-04-16
  Administered 2018-04-16: 10 mg/h via RESPIRATORY_TRACT
  Filled 2018-04-16: qty 20

## 2018-04-16 MED ORDER — PREDNISONE 20 MG PO TABS: 40.0000 mg | ORAL_TABLET | Freq: Every day | ORAL | 0 refills | Status: DC

## 2018-04-16 NOTE — ED Triage Notes (Signed)
Per patient SOB since this AM. Home neb did not work for her. Per EMS 10 albuterol, 1 Atrovent, 125 solu-medrol given.

## 2018-04-16 NOTE — ED Provider Notes (Signed)
[place] EMERGENCY DEPARTMENT Provider Note   CSN: [id number] Arrival date & time: 04/16/18  0759     History   Chief Complaint Chief Complaint  Patient presents with  . Shortness of Breath    HPI Audrey [name] is a 84 y.o. female.  HPI Patient presents via EMS due to dyspnea. Onset was earlier today, without clear precipitant. Since onset she has had persistent wheezing, minimally improved with medication provided by EMS providers. She denies pain, syncope. EMS notes the patient was tachypneic, with diminished breath sounds on arrival, received albuterol in route.  Past Medical History:  Diagnosis Date  . Asthma   . Hypertension     Patient Active Problem List   Diagnosis Date Noted  . Status asthmaticus 11/18/2013  . Asthma with acute exacerbation 10/09/2012    Past Surgical History:  Procedure Laterality Date  . ABDOMINAL HYSTERECTOMY    . BREAST BIOPSY Right 1999   Stereo Core  . BREAST EXCISIONAL BIOPSY Right 1995     OB History   None      Home Medications    Prior to Admission medications   Medication Sig Start Date End Date Taking? Authorizing Provider  albuterol (PROVENTIL HFA;VENTOLIN HFA) 108 (90 BASE) MCG/ACT inhaler Inhale 2 puffs into the lungs every 6 (six) hours as needed for shortness of breath.    Yes [provider]  albuterol (PROVENTIL) (2.5 MG/3ML) 0.083% nebulizer solution Inhale 2.5 mg into the lungs 3 (three) times daily as needed for shortness of breath. 01/29/18  Yes [provider]  alendronate (FOSAMAX) 70 MG tablet Take 70 mg by mouth every Wednesday. 01/28/18  Yes [provider]  amLODipine (NORVASC) 5 MG tablet Take 5 mg by mouth daily.   Yes [provider]  lovastatin (MEVACOR) 40 MG tablet Take 40 mg by mouth daily. 01/18/16  Yes [provider]  omega-3 acid ethyl esters (LOVAZA) 1 G capsule Take 1 g by mouth daily. Reported on 04/14/2016   Yes [provider]  OVER THE COUNTER MEDICATION Take 1 tablet by mouth daily. brain support vitamin   Yes [provider]  triamcinolone cream (KENALOG) 0.5 % Apply 1 application topically 2 (two) times daily as needed (itchy skin).  02/02/16  Yes [provider]  predniSONE (DELTASONE) 20 MG tablet Take 2 tablets (40 mg total) by mouth daily with breakfast. For the next four days 04/16/18   Carmin Muskrat, MD    Family History Family History  Problem Relation Age of Onset  . Breast cancer Mother   . Lung cancer Father     Social History Social History   Tobacco Use  . Smoking status: Never Smoker  . Smokeless tobacco: Never Used  Substance Use Topics  . Alcohol use: Yes    Comment: ocassional  . Drug use: No     Allergies   Penicillins   Review of Systems Review of Systems  Constitutional:       Per HPI, otherwise negative  HENT:       Per HPI, otherwise negative  Respiratory:       Per HPI, otherwise negative  Cardiovascular:       Per HPI, otherwise negative  Gastrointestinal: Negative for vomiting.  Endocrine:       Negative aside from HPI  Genitourinary:       Neg aside from HPI   Musculoskeletal:       Per HPI, otherwise negative  Skin: Negative.  Neurological: Negative for syncope.     Physical Exam Updated Vital Signs BP (!) 167/72   Pulse (!) 102   Temp 98.5 F (36.9 C) (Oral)   Resp 17   Ht 5\' 3"  (1.6 m)   Wt 81.6 kg (180 lb)   SpO2 93%   BMI 31.89 kg/m   Physical Exam  Constitutional: She is oriented to person, place, and time. She has a sickly appearance.  Uncomfortable appearing elderly female receiving albuterol via facemask  HENT:  Head: Normocephalic and atraumatic.  Eyes: Conjunctivae and EOM are normal.  Cardiovascular: Regular rhythm. Tachycardia present.  Pulmonary/Chest: No stridor. She has decreased breath sounds. She has wheezes.  Abdominal: She exhibits no distension.  Musculoskeletal: She exhibits no  edema.  Neurological: She is alert and oriented to person, place, and time. No cranial nerve deficit.  Skin: Skin is warm and dry.  Psychiatric: She has a normal mood and affect.  Nursing note and vitals reviewed.    ED Treatments / Results  Labs (all labs ordered are listed, but only abnormal results are displayed) Labs Reviewed  COMPREHENSIVE METABOLIC PANEL - Abnormal; Notable for the following components:      Result Value   Potassium 3.3 (*)    Glucose, Bld 140 (*)    GFR calc non Af Amer 60 (*)    All other components within normal limits  CBC WITH DIFFERENTIAL/PLATELET  TROPONIN I  BRAIN NATRIURETIC PEPTIDE    EKG EKG Interpretation  Date/Time:  Thursday April 16 2018 08:08:01 EDT Ventricular Rate:  89 PR Interval:    QRS Duration: 84 QT Interval:  372 QTC Calculation: 453 R Axis:   38 Text Interpretation:  Sinus rhythm Probable anterior infarct, old T wave abnormality Artifact Abnormal ekg Confirmed by Carmin Muskrat [phone]) on 04/16/2018 9:10:01 AM   Radiology Dg Chest 2 View  Result Date: 04/16/2018 CLINICAL DATA:  Shortness of breath for 1 week.  Asthma. EXAM: CHEST - 2 VIEW COMPARISON:  One-view chest x-ray 10/24/2016. FINDINGS: The heart is mildly enlarged. There is no edema or effusion. No focal airspace disease is present. The lungs are mildly hyperinflated. The visualized soft tissues and bony thorax are unremarkable. IMPRESSION: 1. Stable cardiomegaly without failure. 2. No acute cardiopulmonary disease. Electronically Signed   By: San Morelle M.D.   On: 04/16/2018 09:44    Procedures Procedures (including critical care time)  Medications Ordered in ED Medications  albuterol (PROVENTIL,VENTOLIN) solution continuous neb (0 mg/hr Nebulization Stopped 04/16/18 1143)     Initial Impression / Assessment and Plan / ED Course  I have reviewed the triage vital signs and the nursing notes.  Pertinent labs & imaging results that were available  during my care of the patient were reviewed by me and considered in my medical decision making (see chart for details).     2:18 PM Patient is improved substantially after multiple breathing treatments, steroids, is awake, alert, no hypoxia, no increased work of breathing, clearly audible breath sounds bilaterally. With no evidence for pneumonia, substantial improvement of her asthma exacerbation she was discharged in stable condition.  Final Clinical Impressions(s) / ED Diagnoses   Final diagnoses:  SOB (shortness of breath)    ED Discharge Orders        Ordered    predniSONE (DELTASONE) 20 MG tablet  Daily with breakfast     04/16/18 1415       Carmin Muskrat, MD 04/16/18 1418

## 2018-04-16 NOTE — Discharge Instructions (Signed)
As discussed, today's evaluation was generally reassuring.  Please use the provided steroids for the next 4 days, and use your albuterol inhaler every 4 hours for the next 2 days, then as needed for additional relief.  Return here for concerning changes in your condition.

## 2018-07-22 ENCOUNTER — Emergency Department (HOSPITAL_COMMUNITY): Payer: Medicare Other

## 2018-07-22 ENCOUNTER — Observation Stay (HOSPITAL_COMMUNITY)
Admission: EM | Admit: 2018-07-22 | Discharge: 2018-07-27 | Disposition: A | Discharge status: Skilled Nursing Facility | Payer: Medicare Other | Attending: Internal Medicine | Admitting: Internal Medicine

## 2018-07-22 ENCOUNTER — Other Ambulatory Visit: Payer: Self-pay

## 2018-07-22 ENCOUNTER — Encounter (HOSPITAL_COMMUNITY): Payer: Self-pay | Admitting: Emergency Medicine

## 2018-07-22 DIAGNOSIS — Y9222 Religious institution as the place of occurrence of the external cause: Secondary | ICD-10-CM | POA: Insufficient documentation

## 2018-07-22 DIAGNOSIS — D649 Anemia, unspecified: Secondary | ICD-10-CM | POA: Insufficient documentation

## 2018-07-22 DIAGNOSIS — W1839XA Other fall on same level, initial encounter: Secondary | ICD-10-CM | POA: Diagnosis not present

## 2018-07-22 DIAGNOSIS — E785 Hyperlipidemia, unspecified: Secondary | ICD-10-CM | POA: Diagnosis not present

## 2018-07-22 DIAGNOSIS — R7303 Prediabetes: Secondary | ICD-10-CM

## 2018-07-22 DIAGNOSIS — Z79899 Other long term (current) drug therapy: Secondary | ICD-10-CM | POA: Diagnosis not present

## 2018-07-22 DIAGNOSIS — S82041A Displaced comminuted fracture of right patella, initial encounter for closed fracture: Secondary | ICD-10-CM | POA: Diagnosis not present

## 2018-07-22 DIAGNOSIS — M810 Age-related osteoporosis without current pathological fracture: Secondary | ICD-10-CM | POA: Insufficient documentation

## 2018-07-22 DIAGNOSIS — Y9301 Activity, walking, marching and hiking: Secondary | ICD-10-CM | POA: Diagnosis not present

## 2018-07-22 DIAGNOSIS — J45909 Unspecified asthma, uncomplicated: Secondary | ICD-10-CM

## 2018-07-22 DIAGNOSIS — S82009A Unspecified fracture of unspecified patella, initial encounter for closed fracture: Secondary | ICD-10-CM | POA: Diagnosis present

## 2018-07-22 DIAGNOSIS — E669 Obesity, unspecified: Secondary | ICD-10-CM | POA: Diagnosis not present

## 2018-07-22 DIAGNOSIS — K5900 Constipation, unspecified: Secondary | ICD-10-CM | POA: Insufficient documentation

## 2018-07-22 DIAGNOSIS — R55 Syncope and collapse: Principal | ICD-10-CM

## 2018-07-22 DIAGNOSIS — R42 Dizziness and giddiness: Secondary | ICD-10-CM | POA: Diagnosis not present

## 2018-07-22 DIAGNOSIS — Z6831 Body mass index (BMI) 31.0-31.9, adult: Secondary | ICD-10-CM | POA: Diagnosis not present

## 2018-07-22 DIAGNOSIS — I10 Essential (primary) hypertension: Secondary | ICD-10-CM | POA: Diagnosis not present

## 2018-07-22 DIAGNOSIS — Z7983 Long term (current) use of bisphosphonates: Secondary | ICD-10-CM | POA: Insufficient documentation

## 2018-07-22 LAB — BASIC METABOLIC PANEL
Anion gap: 9 (ref 5–15)
BUN: 18 mg/dL (ref 8–23)
CHLORIDE: 103 mmol/L (ref 98–111)
CO2: 28 mmol/L (ref 22–32)
CREATININE: 0.79 mg/dL (ref 0.44–1.00)
Calcium: 9.7 mg/dL (ref 8.9–10.3)
Glucose, Bld: 102 mg/dL — ABNORMAL HIGH (ref 70–99)
POTASSIUM: 3.6 mmol/L (ref 3.5–5.1)
SODIUM: 140 mmol/L (ref 135–145)

## 2018-07-22 LAB — CBC WITH DIFFERENTIAL/PLATELET
ABS IMMATURE GRANULOCYTES: 0.08 10*3/uL — AB (ref 0.00–0.07)
BASOS ABS: 0.1 10*3/uL (ref 0.0–0.1)
BASOS PCT: 1 %
Eosinophils Absolute: 0.1 10*3/uL (ref 0.0–0.5)
Eosinophils Relative: 2 %
HCT: 42.9 % (ref 36.0–46.0)
HEMOGLOBIN: 13.2 g/dL (ref 12.0–15.0)
Immature Granulocytes: 1 %
LYMPHS PCT: 37 %
Lymphs Abs: 3.2 10*3/uL (ref 0.7–4.0)
MCH: 29.8 pg (ref 26.0–34.0)
MCHC: 30.8 g/dL (ref 30.0–36.0)
MCV: 96.8 fL (ref 80.0–100.0)
Monocytes Absolute: 0.6 10*3/uL (ref 0.1–1.0)
Monocytes Relative: 7 %
NEUTROS ABS: 4.5 10*3/uL (ref 1.7–7.7)
Neutrophils Relative %: 52 %
PLATELETS: 279 10*3/uL (ref 150–400)
RBC: 4.43 MIL/uL (ref 3.87–5.11)
RDW: 13.6 % (ref 11.5–15.5)
WBC: 8.6 10*3/uL (ref 4.0–10.5)
nRBC: 0 % (ref 0.0–0.2)

## 2018-07-22 LAB — URINALYSIS, COMPLETE (UACMP) WITH MICROSCOPIC
BACTERIA UA: NONE SEEN
Bilirubin Urine: NEGATIVE
GLUCOSE, UA: NEGATIVE mg/dL
Hgb urine dipstick: NEGATIVE
Ketones, ur: NEGATIVE mg/dL
LEUKOCYTES UA: NEGATIVE
Nitrite: NEGATIVE
PROTEIN: NEGATIVE mg/dL
Specific Gravity, Urine: 1.009 (ref 1.005–1.030)
pH: 8 (ref 5.0–8.0)

## 2018-07-22 LAB — TSH: TSH: 3.233 u[IU]/mL (ref 0.350–4.500)

## 2018-07-22 MED ORDER — ACETAMINOPHEN 325 MG PO TABS: 650.0000 mg | ORAL_TABLET | Freq: Four times a day (QID) | ORAL | Status: DC | PRN

## 2018-07-22 MED ORDER — AMLODIPINE BESYLATE 5 MG PO TABS
5.0000 mg | ORAL_TABLET | Freq: Every day | ORAL | Status: DC
  Administered 2018-07-23 – 2018-07-27 (×5): 5 mg via ORAL
  Filled 2018-07-22 (×6): qty 1

## 2018-07-22 MED ORDER — HYDROCODONE-ACETAMINOPHEN 5-325 MG PO TABS: 1.0000 | ORAL_TABLET | ORAL | Status: DC | PRN

## 2018-07-22 MED ORDER — OMEGA-3-ACID ETHYL ESTERS 1 G PO CAPS
1.0000 g | ORAL_CAPSULE | Freq: Every day | ORAL | Status: DC
  Administered 2018-07-22 – 2018-07-27 (×6): 1 g via ORAL
  Filled 2018-07-22 (×7): qty 1

## 2018-07-22 MED ORDER — FENTANYL CITRATE (PF) 100 MCG/2ML IJ SOLN
25.0000 ug | Freq: Once | INTRAMUSCULAR | Status: DC
  Filled 2018-07-22: qty 2

## 2018-07-22 MED ORDER — ACETAMINOPHEN 325 MG PO TABS: 650.0000 mg | ORAL_TABLET | Freq: Once | ORAL | Status: DC

## 2018-07-22 MED ORDER — ONDANSETRON HCL 4 MG PO TABS: 4.0000 mg | ORAL_TABLET | Freq: Four times a day (QID) | ORAL | Status: DC | PRN

## 2018-07-22 MED ORDER — ACETAMINOPHEN 650 MG RE SUPP: 650.0000 mg | Freq: Four times a day (QID) | RECTAL | Status: DC | PRN

## 2018-07-22 MED ORDER — DEXTROSE-NACL 5-0.9 % IV SOLN
INTRAVENOUS | Status: DC
  Administered 2018-07-22 – 2018-07-23 (×4): via INTRAVENOUS

## 2018-07-22 MED ORDER — FENTANYL CITRATE (PF) 100 MCG/2ML IJ SOLN: 25.0000 ug | INTRAMUSCULAR | Status: DC | PRN

## 2018-07-22 MED ORDER — ENOXAPARIN SODIUM 40 MG/0.4ML ~~LOC~~ SOLN
40.0000 mg | SUBCUTANEOUS | Status: DC
  Administered 2018-07-22 – 2018-07-26 (×5): 40 mg via SUBCUTANEOUS
  Filled 2018-07-22 (×5): qty 0.4

## 2018-07-22 MED ORDER — SENNOSIDES-DOCUSATE SODIUM 8.6-50 MG PO TABS
1.0000 | ORAL_TABLET | Freq: Every evening | ORAL | Status: DC | PRN
  Administered 2018-07-24: 1 via ORAL
  Filled 2018-07-22: qty 1

## 2018-07-22 MED ORDER — ALBUTEROL SULFATE (2.5 MG/3ML) 0.083% IN NEBU: 2.5000 mg | INHALATION_SOLUTION | Freq: Three times a day (TID) | RESPIRATORY_TRACT | Status: DC | PRN

## 2018-07-22 MED ORDER — ONDANSETRON HCL 4 MG/2ML IJ SOLN: 4.0000 mg | Freq: Four times a day (QID) | INTRAMUSCULAR | Status: DC | PRN

## 2018-07-22 MED ORDER — PRAVASTATIN SODIUM 40 MG PO TABS
40.0000 mg | ORAL_TABLET | Freq: Every day | ORAL | Status: DC
  Administered 2018-07-23 – 2018-07-26 (×4): 40 mg via ORAL
  Filled 2018-07-22 (×4): qty 1

## 2018-07-22 NOTE — ED Triage Notes (Addendum)
[place] reports fall and right knee pain. States that she cannot put any weight on it. Pain 10/10. Denies LOC, denies hitting head.

## 2018-07-22 NOTE — ED Provider Notes (Signed)
[facility] DEPT Provider Note   CSN: [id number] Arrival date & time: 07/22/18  1045     History   Chief Complaint Chief Complaint  Patient presents with  . Fall  . Knee Pain    HPI Audrey [name] is a 84 y.o. female.  Patient states that her right leg gave out on her while walking today at church.  She states that she landed on her right knee.  Has been unable to walk since.  Denies hitting her head.  No loss of consciousness.  No other bony pain except for the right knee.  Denies any surgery or injury to this leg before.  The history is provided by the patient.  Fall  This is a new problem. The current episode started 1 to 2 hours ago. The problem occurs constantly. The problem has not changed since onset.Pertinent negatives include no chest pain, no abdominal pain and no shortness of breath. The symptoms are aggravated by standing and walking. Nothing relieves the symptoms. She has tried nothing for the symptoms. The treatment provided no relief.    Past Medical History:  Diagnosis Date  . Asthma   . Hypertension     Patient Active Problem List   Diagnosis Date Noted  . Status asthmaticus 11/18/2013  . Asthma with acute exacerbation 10/09/2012    Past Surgical History:  Procedure Laterality Date  . ABDOMINAL HYSTERECTOMY    . BREAST BIOPSY Right 1999   Stereo Core  . BREAST EXCISIONAL BIOPSY Right 1995     OB History   None      Home Medications    Prior to Admission medications   Medication Sig Start Date End Date Taking? Authorizing Provider  albuterol (PROVENTIL HFA;VENTOLIN HFA) 108 (90 BASE) MCG/ACT inhaler Inhale 2 puffs into the lungs every 6 (six) hours as needed for shortness of breath.    Yes [provider]  albuterol (PROVENTIL) (2.5 MG/3ML) 0.083% nebulizer solution Inhale 2.5 mg into the lungs 3 (three) times daily as needed for shortness of breath. 01/29/18  Yes [provider]  alendronate  (FOSAMAX) 70 MG tablet Take 70 mg by mouth every Wednesday. 01/28/18  Yes [provider]  amLODipine (NORVASC) 5 MG tablet Take 5 mg by mouth daily.   Yes [provider]  lovastatin (MEVACOR) 40 MG tablet Take 40 mg by mouth daily. 01/18/16  Yes [provider]  omega-3 acid ethyl esters (LOVAZA) 1 G capsule Take 1 g by mouth daily. Reported on 04/14/2016   Yes [provider]  OVER THE COUNTER MEDICATION Take 1 tablet by mouth daily. brain support vitamin   Yes [provider]  triamcinolone cream (KENALOG) 0.5 % Apply 1 application topically 2 (two) times daily as needed (itchy skin).  02/02/16  Yes [provider]  predniSONE (DELTASONE) 20 MG tablet Take 2 tablets (40 mg total) by mouth daily with breakfast. For the next four days Patient not taking: Reported on 07/22/2018 04/16/18   Carmin Muskrat, MD    Family History Family History  Problem Relation Age of Onset  . Breast cancer Mother   . Lung cancer Father     Social History Social History   Tobacco Use  . Smoking status: Never Smoker  . Smokeless tobacco: Never Used  Substance Use Topics  . Alcohol use: Yes    Comment: ocassional  . Drug use: No     Allergies   Penicillins   Review of Systems Review of  Systems  Constitutional: Negative for chills and fever.  HENT: Negative for ear pain and sore throat.   Eyes: Negative for pain and visual disturbance.  Respiratory: Negative for cough and shortness of breath.   Cardiovascular: Negative for chest pain and palpitations.  Gastrointestinal: Negative for abdominal pain and vomiting.  Genitourinary: Negative for dysuria and hematuria.  Musculoskeletal: Positive for arthralgias and gait problem. Negative for back pain.  Skin: Negative for color change and rash.  Neurological: Negative for seizures and syncope.  All other systems reviewed and are negative.    Physical Exam Updated Vital Signs  ED Triage Vitals  [07/22/18 1100]  Enc Vitals Group     BP (!) 189/93     Pulse Rate 68     Resp 19     Temp 98.2 F (36.8 C)     Temp Source Oral     SpO2 99 %     Weight      Height      Head Circumference      Peak Flow      Pain Score 10     Pain Loc      Pain Edu?      Excl. in [place]?     Physical Exam  Constitutional: She is oriented to person, place, and time. She appears well-developed and well-nourished. No distress.  HENT:  Head: Normocephalic and atraumatic.  Eyes: Pupils are equal, round, and reactive to light. Conjunctivae and EOM are normal.  Neck: Normal range of motion. Neck supple.  Cardiovascular: Normal rate, regular rhythm, normal heart sounds and intact distal pulses.  No murmur heard. Pulmonary/Chest: Effort normal and breath sounds normal. No respiratory distress.  Abdominal: Soft. Bowel sounds are normal. She exhibits no distension. There is no tenderness.  Musculoskeletal: She exhibits tenderness (TTP around right knee and thigh). She exhibits no edema or deformity.  Decreased ROM of RLE secondary to pain   Neurological: She is alert and oriented to person, place, and time.  Difficult to assess strength in the right lower extremity secondary to pain however has normal motor function and sensation in the right lower extremity, 5+ out of 5 strength in left lower extremity normal sensation  Skin: Skin is warm and dry. Capillary refill takes less than 2 seconds.  Psychiatric: She has a normal mood and affect.  Nursing note and vitals reviewed.    ED Treatments / Results  Labs (all labs ordered are listed, but only abnormal results are displayed) Labs Reviewed  CBC WITH DIFFERENTIAL/PLATELET  BASIC METABOLIC PANEL    EKG None  Radiology Dg Knee Complete 4 Views Right  Result Date: 07/22/2018 CLINICAL DATA:  Right knee pain after fall. EXAM: RIGHT KNEE - COMPLETE 4+ VIEW COMPARISON:  None. FINDINGS: Moderately displaced and comminuted fracture is seen involving the  patella. The visualized femur, tibia and fibula appear intact. Joint spaces are unremarkable. Mild suprapatellar joint effusion is noted. IMPRESSION: Comminuted and displaced patellar fracture. Electronically Signed   By: Marijo Conception, M.D.   On: 07/22/2018 13:21   Dg Hip Unilat W Or Wo Pelvis 2-3 Views Right  Result Date: 07/22/2018 CLINICAL DATA:  Fall. EXAM: DG HIP (WITH OR WITHOUT PELVIS) 2-3V RIGHT COMPARISON:  None. FINDINGS: There is no evidence of hip fracture or dislocation. There is no evidence of arthropathy or other focal bone abnormality. IMPRESSION: Negative. Electronically Signed   By: Marijo Conception, M.D.   On: 07/22/2018 13:22   Dg Femur Min 2  Views Right  Result Date: 07/22/2018 CLINICAL DATA:  Fall. EXAM: RIGHT FEMUR 2 VIEWS COMPARISON:  None. FINDINGS: Patellar fracture is again noted.  The femur appears normal. IMPRESSION: Moderately displaced patellar fracture is noted. Femur appears normal. Electronically Signed   By: Marijo Conception, M.D.   On: 07/22/2018 13:23    Procedures Procedures (including critical care time)  Medications Ordered in ED Medications  acetaminophen (TYLENOL) tablet 650 mg (650 mg Oral Refused 07/22/18 1223)  fentaNYL (SUBLIMAZE) injection 25 mcg (0 mcg Intravenous Hold 07/22/18 1440)     Initial Impression / Assessment and Plan / ED Course  I have reviewed the triage vital signs and the nursing notes.  Pertinent labs & imaging results that were available during my care of the patient were reviewed by me and considered in my medical decision making (see chart for details).     Audrey [name] is an 84 year old female with history of asthma, hypertension who presents to the ED with right knee pain after a fall.  Patient with unremarkable vitals.  No fever.  Patient states that her right leg gave out on her while walking today and she landed on her right kneecap.  She did not hit her head, no loss consciousness.  Has not been able to  ambulate since.  Has no prior history of right knee issues.  She typically walks around on her own without any assistance.  She lives at home by herself.  Patient is not on any blood thinners.  Patient has diffuse tenderness to the right patellar area.  Otherwise has normal strength and sensation.  No concern for quadricep tendon injury.  X-rays confirmed displaced and comminuted patellar fracture.  Discussed the case with Dr. Tamera Punt with orthopedics who recommend weightbearing as tolerated while in a knee immobilizer.  This will be nonoperative and patient can follow-up with him outpatient.  Patient will need admission for PT/OT evaluation and likely placement due to pain while trying to move her right knee.  Patient had medical clearance labs ordered and admitted to hospital service in stable condition.  Final Clinical Impressions(s) / ED Diagnoses   Final diagnoses:  Closed displaced comminuted fracture of right patella, initial encounter    ED Discharge Orders    None       Lennice Sites, DO 07/22/18 1457

## 2018-07-22 NOTE — Progress Notes (Signed)
Received report from [place]. RN stated the patient had admission orders, no telemetry orders and IV access in the [facility]. I could not find admission order and no IV charted. Patient arrived to the unit with transporter, patient placement called and stated the patient had telemetry ordered and was going to be admitted to the 4th floor. Our secretary explained the patient needs telemetry and we were not sure of the new room number, stated to check with the ED nurse for new bed assignment. Transporter got back on the elevator with the patient but I could not see if she went up or down. Courtney from 4E called and wanted to double check that 3W knew we were no longer receiving this patient. Charge nurse Kenna Gilbert F,RN made aware.

## 2018-07-22 NOTE — H&P (Signed)
History and Physical    SHELISE MARON [name]:[id number] DOB: 29-Dec-1932 DOA: 07/22/2018  PCP: Wenda Low, MD   Patient coming from: Home  Chief Complaint: Fall  HPI: Audrey [name] is a 84 y.o. female with medical history significant of HTN, HLD, Asthma, IFG/Pre-Diabetes and other comorbidities who presents with a chief complaint of fall and immediate right knee pain.  Patient was in her normal state of health this morning she was ambulating on her way to church between 930 and 10:00 she and up falling and was unable to get up.  Upon her fall she had immediate pain was sharp at 9 out of 10 in severity nonradiating.  Patient states that initially she felt dizziness and fell to the floor but did not actually pass out.  No nausea or vomiting.  Denies any chest pain, lightheadedness or dizziness.  No other concerns or complaints at this time and [facility] was called to admit this patient for presyncope and right knee pain.  Orthopedics Dr. Tamera Punt was consulted by the EDP and they recommended nonoperative management  ED Course: Patient had basic blood work done, given pain control, and had imaging of her hip, knee and femur.  Review of Systems: As per HPI otherwise 10 point review of systems negative.   Past Medical History:  Diagnosis Date  . Asthma   . Hypertension    Past Surgical History:  Procedure Laterality Date  . ABDOMINAL HYSTERECTOMY    . BREAST BIOPSY Right 1999   Stereo Core  . BREAST EXCISIONAL BIOPSY Right 1995   SOCIAL HISTORY  reports that she has never smoked. She has never used smokeless tobacco. She reports that she drinks alcohol. She reports that she does not use drugs.  Allergies  Allergen Reactions  . Penicillins Other (See Comments)    Hallucinations Has patient had a PCN reaction causing immediate rash, facial/tongue/throat swelling, SOB or lightheadedness with hypotension:No Has patient had a PCN reaction causing severe rash involving mucus membranes or skin  necrosis:No Has patient had a PCN reaction that required hospitalization:No Has patient had a PCN reaction occurring within the last 10 years:NO If all of the above answers are "NO", then may proceed with Cephalosporin use.    Family History  Problem Relation Age of Onset  . Breast cancer Mother   . Lung cancer Father    Prior to Admission medications   Medication Sig Start Date End Date Taking? Authorizing Provider  albuterol (PROVENTIL HFA;VENTOLIN HFA) 108 (90 BASE) MCG/ACT inhaler Inhale 2 puffs into the lungs every 6 (six) hours as needed for shortness of breath.    Yes [provider]  albuterol (PROVENTIL) (2.5 MG/3ML) 0.083% nebulizer solution Inhale 2.5 mg into the lungs 3 (three) times daily as needed for shortness of breath. 01/29/18  Yes [provider]  alendronate (FOSAMAX) 70 MG tablet Take 70 mg by mouth every Wednesday. 01/28/18  Yes [provider]  amLODipine (NORVASC) 5 MG tablet Take 5 mg by mouth daily.   Yes [provider]  lovastatin (MEVACOR) 40 MG tablet Take 40 mg by mouth daily. 01/18/16  Yes [provider]  omega-3 acid ethyl esters (LOVAZA) 1 G capsule Take 1 g by mouth daily. Reported on 04/14/2016   Yes [provider]  OVER THE COUNTER MEDICATION Take 1 tablet by mouth daily. brain support vitamin   Yes [provider]  triamcinolone cream (KENALOG) 0.5 % Apply 1 application topically 2 (two) times daily as needed (itchy skin).  02/02/16  Yes [provider]  predniSONE (DELTASONE) 20 MG tablet Take 2 tablets (40 mg total) by mouth daily with breakfast. For the next four days Patient not taking: Reported on 07/22/2018 04/16/18   Carmin Muskrat, MD   Physical Exam: Vitals:   07/22/18 1100 07/22/18 1457  BP: (!) 189/93 (!) 183/76  Pulse: 68 (!) 59  Resp: 19 18  Temp: 98.2 F (36.8 C)   TempSrc: Oral   SpO2: 99% 99%   Constitutional: WN/WD obese AAF in NAD and appears slightly  anxious Eyes: Lids and conjunctivae normal, sclerae anicteric  ENMT: External Ears, Nose appear normal. Grossly normal hearing. Mucous membranes are moist.   Neck: Appears normal, supple, no cervical masses, normal ROM, no appreciable thyromegaly, no JVD Respiratory: Diminished to auscultation bilaterally, no wheezing, rales, rhonchi or crackles. Normal respiratory effort and patient is not tachypenic. No accessory muscle use.  Cardiovascular: RRR, no murmurs / rubs / gallops. S1 and S2 auscultated.  Abdomen: Soft, non-tender, Slightly distended. No masses palpated. No appreciable hepatosplenomegaly. Bowel sounds positive x4.  GU: Deferred. Musculoskeletal: Right Knee is immobilized Skin: No rashes, lesions, ulcers on a limited skin eval. No induration; Warm and dry.  Neurologic: CN 2-12 grossly intact with no focal deficits.  Romberg sign and cerebellar reflexes not assessed.  Psychiatric: Normal judgment and insight. Alert and oriented x 3. Normal mood and appropriate affect.   Labs on Admission: I have personally reviewed following labs and imaging studies  CBC: Recent Labs  Lab 07/22/18 1442  WBC 8.6  NEUTROABS 4.5  HGB 13.2  HCT 42.9  MCV 96.8  PLT 269   Basic Metabolic Panel: Recent Labs  Lab 07/22/18 1442  NA 140  K 3.6  CL 103  CO2 28  GLUCOSE 102*  BUN 18  CREATININE 0.79  CALCIUM 9.7   GFR: CrCl cannot be calculated (Unknown ideal weight.). Liver Function Tests: No results for input(s): AST, ALT, ALKPHOS, BILITOT, PROT, ALBUMIN in the last 168 hours. No results for input(s): LIPASE, AMYLASE in the last 168 hours. No results for input(s): AMMONIA in the last 168 hours. Coagulation Profile: No results for input(s): INR, PROTIME in the last 168 hours. Cardiac Enzymes: No results for input(s): CKTOTAL, CKMB, CKMBINDEX, TROPONINI in the last 168 hours. BNP (last 3 results) No results for input(s): PROBNP in the last 8760 hours. HbA1C: No results for input(s):  HGBA1C in the last 72 hours. CBG: No results for input(s): GLUCAP in the last 168 hours. Lipid Profile: No results for input(s): CHOL, HDL, LDLCALC, TRIG, CHOLHDL, LDLDIRECT in the last 72 hours. Thyroid Function Tests: No results for input(s): TSH, T4TOTAL, FREET4, T3FREE, THYROIDAB in the last 72 hours. Anemia Panel: No results for input(s): VITAMINB12, FOLATE, FERRITIN, TIBC, IRON, RETICCTPCT in the last 72 hours. Urine analysis:    Component Value Date/Time   COLORURINE YELLOW 07/19/2009 2224   APPEARANCEUR CLEAR 07/19/2009 2224   LABSPEC 1.021 07/19/2009 2224   PHURINE 6.0 07/19/2009 2224   GLUCOSEU NEGATIVE 07/19/2009 2224   HGBUR SMALL (A) 07/19/2009 2224   BILIRUBINUR SMALL (A) 07/19/2009 2224   KETONESUR >80 (A) 07/19/2009 2224   PROTEINUR 30 (A) 07/19/2009 2224   UROBILINOGEN 0.2 07/19/2009 2224   NITRITE NEGATIVE 07/19/2009 2224   LEUKOCYTESUR SMALL (A) 07/19/2009 2224   Sepsis Labs: !!!!!!!!!!!!!!!!!!!!!!!!!!!!!!!!!!!!!!!!!!!! @LABRCNTIP (procalcitonin:4,lacticidven:4) )No results found for this or any previous visit (from the past 240 hour(s)).   Radiological Exams on Admission: Dg Knee Complete 4 Views Right  Result Date:  07/22/2018 CLINICAL DATA:  Right knee pain after fall. EXAM: RIGHT KNEE - COMPLETE 4+ VIEW COMPARISON:  None. FINDINGS: Moderately displaced and comminuted fracture is seen involving the patella. The visualized femur, tibia and fibula appear intact. Joint spaces are unremarkable. Mild suprapatellar joint effusion is noted. IMPRESSION: Comminuted and displaced patellar fracture. Electronically Signed   By: Marijo Conception, M.D.   On: 07/22/2018 13:21   Dg Hip Unilat W Or Wo Pelvis 2-3 Views Right  Result Date: 07/22/2018 CLINICAL DATA:  Fall. EXAM: DG HIP (WITH OR WITHOUT PELVIS) 2-3V RIGHT COMPARISON:  None. FINDINGS: There is no evidence of hip fracture or dislocation. There is no evidence of arthropathy or other focal bone abnormality.  IMPRESSION: Negative. Electronically Signed   By: Marijo Conception, M.D.   On: 07/22/2018 13:22   Dg Femur Min 2 Views Right  Result Date: 07/22/2018 CLINICAL DATA:  Fall. EXAM: RIGHT FEMUR 2 VIEWS COMPARISON:  None. FINDINGS: Patellar fracture is again noted.  The femur appears normal. IMPRESSION: Moderately displaced patellar fracture is noted. Femur appears normal. Electronically Signed   By: Marijo Conception, M.D.   On: 07/22/2018 13:23    EKG: No EKG done on arrival to the ED so will order one now.   Assessment/Plan Active Problems:   Patellar fracture   Asthma   HLD (hyperlipidemia)   Pre-diabetes   Essential hypertension   Pre-syncope   Dizziness  Pre-Syncope and Dizziness -Place in observation telemetry -Cycle cardiac troponins to rule out ischemic causes -Continue telemetry monitoring -Check echocardiogram -Check orthostatic vital signs -Gentle IV fluid hydration with normal saline rate to 75 mils per hour -PT/OT to evaluate and treat -Check TSH -Check urinalysis and urine culture -Follow-up on PT recommendations  Acute Patellar Fracture -PT/OT to evaluate and treat -Orthopedics was consulted by the EDP and recommends nonoperative management -Continue with pain control with acetaminophen, hydrocodone and as needed Fentanyl  Hypertension -Blood pressure likely elevated in the setting of pain -Continue home amlodipine -Will add PRN Hydralazine  HLD -Continue home lovastatin substitution with pravastatin and omega-3 fish oil  Asthma -Continue PRN albuterol nebs  Hyperglycemia -Check hemoglobin A1c -Continue monitor CBGs and place on sensitive NovoLog sliding scale insulin  Osteoporosis -Takes of Fosamax as an outpatient -May need vitamin D and calcium supplementation  DVT prophylaxis: Lovenox 40 g subcu Code Status: Full code Family Communication: No family present at bedside Disposition Plan: Pending PT and OT evaluation Consults called: EDP called  Orthopedics Dr. Tamera Punt Admission status: Obs Telemetry  Severity of Illness: The appropriate patient status for this patient is OBSERVATION. Observation status is judged to be reasonable and necessary in order to provide the required intensity of service to ensure the patient's safety. The patient's presenting symptoms, physical exam findings, and initial radiographic and laboratory data in the context of their medical condition is felt to place them at decreased risk for further clinical deterioration. Furthermore, it is anticipated that the patient will be medically stable for discharge from the hospital within 2 midnights of admission. The following factors support the patient status of observation.   " The patient's presenting symptoms include Knee Pain. " The physical exam findings include Tender Knee. " The initial radiographic and laboratory data are showing a comminuted and displaced patellar fracture.  Kerney Elbe, D.O. [facility] PAGER is on [place]  If 7PM-7AM, please contact night-coverage [email or web address] Password [facility]  07/22/2018, 4:47 PM

## 2018-07-22 NOTE — ED Notes (Signed)
Bed: FO25 Expected date:  Expected time:  Means of arrival:  Comments: EMS/fall/knee pain

## 2018-07-23 ENCOUNTER — Observation Stay (HOSPITAL_COMMUNITY): Payer: Medicare Other

## 2018-07-23 ENCOUNTER — Encounter (HOSPITAL_COMMUNITY): Payer: Self-pay

## 2018-07-23 ENCOUNTER — Observation Stay (HOSPITAL_BASED_OUTPATIENT_CLINIC_OR_DEPARTMENT_OTHER): Payer: Medicare Other

## 2018-07-23 DIAGNOSIS — I10 Essential (primary) hypertension: Secondary | ICD-10-CM | POA: Diagnosis not present

## 2018-07-23 DIAGNOSIS — J45909 Unspecified asthma, uncomplicated: Secondary | ICD-10-CM | POA: Diagnosis not present

## 2018-07-23 DIAGNOSIS — S82041A Displaced comminuted fracture of right patella, initial encounter for closed fracture: Secondary | ICD-10-CM | POA: Diagnosis not present

## 2018-07-23 DIAGNOSIS — R55 Syncope and collapse: Secondary | ICD-10-CM

## 2018-07-23 DIAGNOSIS — R42 Dizziness and giddiness: Secondary | ICD-10-CM | POA: Diagnosis not present

## 2018-07-23 LAB — COMPREHENSIVE METABOLIC PANEL
ALBUMIN: 3.5 g/dL (ref 3.5–5.0)
ALT: 11 U/L (ref 0–44)
AST: 17 U/L (ref 15–41)
Alkaline Phosphatase: 46 U/L (ref 38–126)
Anion gap: 9 (ref 5–15)
BUN: 13 mg/dL (ref 8–23)
CHLORIDE: 108 mmol/L (ref 98–111)
CO2: 24 mmol/L (ref 22–32)
CREATININE: 0.93 mg/dL (ref 0.44–1.00)
Calcium: 9 mg/dL (ref 8.9–10.3)
GFR calc Af Amer: >60 mL/min (ref >60)
GFR, EST NON AFRICAN AMERICAN: 55 mL/min — AB (ref >60)
GLUCOSE: 138 mg/dL — AB (ref 70–99)
POTASSIUM: 3.8 mmol/L (ref 3.5–5.1)
Sodium: 141 mmol/L (ref 135–145)
Total Bilirubin: 0.9 mg/dL (ref 0.3–1.2)
Total Protein: 6.9 g/dL (ref 6.5–8.1)

## 2018-07-23 LAB — TROPONIN I
Troponin I: <0.03 ng/mL (ref <0.03)
Troponin I: <0.03 ng/mL (ref <0.03)

## 2018-07-23 LAB — CBC
HEMATOCRIT: 40 % (ref 36.0–46.0)
HEMOGLOBIN: 12.3 g/dL (ref 12.0–15.0)
MCH: 30.4 pg (ref 26.0–34.0)
MCHC: 30.8 g/dL (ref 30.0–36.0)
MCV: 98.8 fL (ref 80.0–100.0)
Platelets: 263 10*3/uL (ref 150–400)
RBC: 4.05 MIL/uL (ref 3.87–5.11)
RDW: 13.7 % (ref 11.5–15.5)
WBC: 8.8 10*3/uL (ref 4.0–10.5)
nRBC: 0 % (ref 0.0–0.2)

## 2018-07-23 LAB — ECHOCARDIOGRAM COMPLETE
Height: 63 in
Weight: 2825.42 oz

## 2018-07-23 LAB — HEMOGLOBIN A1C
HEMOGLOBIN A1C: 6 % — AB (ref 4.8–5.6)
Mean Plasma Glucose: 125.5 mg/dL

## 2018-07-23 LAB — GLUCOSE, CAPILLARY: Glucose-Capillary: 151 mg/dL — ABNORMAL HIGH (ref 70–99)

## 2018-07-23 NOTE — Care Management Obs Status (Signed)
[facility] NOTIFICATION   Patient Details  Name: YVANA SAMONTE MRN: [id number] Date of Birth: 08-12-1933   Medicare Observation Status Notification Given:  Yes    MahabirJuliann Pulse, RN 07/23/2018, 3:57 PM

## 2018-07-23 NOTE — Plan of Care (Signed)

## 2018-07-23 NOTE — Evaluation (Signed)
Occupational Therapy Evaluation Patient Details Name: Audrey [name] MRN: [id number] DOB: [birth year] Today's Date: 07/23/2018    History of Present Illness Audrey [name] is a 84 y.o. female with medical history significant of HTN, HLD, Asthma, IFG/Pre-Diabetes and other comorbidities who presents with a chief complaint of fall and immediate right knee pain. xray = right patella fx   Clinical Impression   Pt admitted with the above . Pt currently with functional limitations due to the deficits listed below (see OT Problem List).  Pt will benefit from skilled OT to increase their safety and independence with ADL and functional mobility for ADL to facilitate discharge to venue listed below.      Follow Up Recommendations  Home health OT;SNF;Supervision/Assistance - 24 hour(depdending on progress)    Equipment Recommendations  3 in 1 bedside commode    Recommendations for Other Services       Precautions / Restrictions Precautions Precautions: Fall Required Braces or Orthoses: Knee Immobilizer - Right Knee Immobilizer - Right: On at all times Restrictions Other Position/Activity Restrictions: no orders for WBing--await clarification      Mobility Bed Mobility Overal bed mobility: Needs Assistance Bed Mobility: Supine to Sit     Supine to sit: Min assist     General bed mobility comments: with RLE, incr time, heavily reliant on rail  Transfers Overall transfer level: Needs assistance Equipment used: Rolling walker (2 wheeled) Transfers: Sit to/from [facility] Sit to Stand: Min assist Stand pivot transfers: Min assist       General transfer comment: cues for hand placement, RLE management, incr time needed     Balance Overall balance assessment: Needs assistance   Sitting balance-Leahy Scale: Fair       Standing balance-Leahy Scale: Poor Standing balance comment: reliant on UEs                           ADL either performed or  assessed with clinical judgement   ADL Overall ADL's : Needs assistance/impaired Eating/Feeding: Set up;Sitting   Grooming: Set up;Sitting   Upper Body Bathing: Set up;Sitting   Lower Body Bathing: Maximal assistance;Sit to/from stand;Cueing for sequencing;Cueing for safety   Upper Body Dressing : Set up;Sitting   Lower Body Dressing: Maximal assistance;Sit to/from stand;Cueing for sequencing;Cueing for safety   Toilet Transfer: Moderate assistance;Stand-pivot;BSC;RW   Toileting- Clothing Manipulation and Hygiene: Moderate assistance;Sit to/from stand               Vision Patient Visual Report: No change from baseline              Pertinent Vitals/Pain Pain Assessment: Faces Pain Score: 2  Faces Pain Scale: Hurts little more Pain Location: right knee with WBing Pain Descriptors / Indicators: Discomfort Pain Intervention(s): Monitored during session     Hand Dominance     Extremity/Trunk Assessment Upper Extremity Assessment Upper Extremity Assessment: Generalized weakness   Lower Extremity Assessment Lower Extremity Assessment: RLE deficits/detail RLE Deficits / Details: limited by pain, hip flexion with knee extension 2-/5; ankle grossly WFL RLE: Unable to fully assess due to immobilization;Unable to fully assess due to pain       Communication Communication Communication: No difficulties   Cognition Arousal/Alertness: Awake/alert Behavior During Therapy: WFL for tasks assessed/performed Overall Cognitive Status: Within Functional Limits for tasks assessed  Home Living Family/patient expects to be discharged to:: Private residence Living Arrangements: Alone Available Help at Discharge: Available PRN/intermittently Type of Home: House Home Access: Stairs to enter [employer] of Steps: 3   Home Layout: One level     Bathroom Shower/Tub: Engineer, site: [place]: None   Additional Comments: pt reports she has "caregiver" but she only checks on her, does need her for anything               OT Problem List: Decreased strength;Impaired vision/perception;Impaired balance (sitting and/or standing);Pain      OT Treatment/Interventions: Self-care/ADL training;Patient/family education;DME and/or AE instruction    OT Goals(Current goals can be found in the care plan section) Acute Rehab OT Goals Patient Stated Goal: get well OT Goal Formulation: With patient Time For Goal Achievement: 07/30/18  OT Frequency: Min 2X/week   Barriers to [name]/C:               AM-PAC PT "6 Clicks" Daily Activity     Outcome Measure Help from another person eating meals?: A Little Help from another person taking care of personal grooming?: A Little Help from another person toileting, which includes using toliet, bedpan, or urinal?: A Lot Help from another person bathing (including washing, rinsing, drying)?: A Little Help from another person to put on and taking off regular upper body clothing?: A Little Help from another person to put on and taking off regular lower body clothing?: A Lot 6 Click Score: 16   End of Session Nurse Communication: Mobility status  Activity Tolerance: Patient tolerated treatment well Patient left: in chair;with call bell/phone within reach  OT Visit Diagnosis: Unsteadiness on feet (R26.81);Muscle weakness (generalized) (M62.81);History of falling (Z91.81)                Time: 8341-9622 OT Time Calculation (min): 25 min Charges:  OT General Charges $OT Visit: 1 Visit OT Evaluation $OT Eval Moderate Complexity: 1 Mod OT Treatments $Self Care/Home Management : 8-22 mins  Kari Baars, OT Acute Rehabilitation Services [phone] Office- [phone]     [place], Audrey [name] 07/23/2018, 1:31 PM

## 2018-07-23 NOTE — Care Management Note (Signed)
Case Management Note  Patient Details  Name: SHAMARIA KAVAN MRN: [id number] Date of Birth: May 10, 1933  Subjective/Objective:Admitted w/Fall,patellar fx. From home alone. PT recc SNF-patient in agreement-CSW notified.                    Action/Plan:dc plan SNF.   Expected Discharge Date:  (unknown)               Expected Discharge Plan:  Skilled Nursing Facility  In-House Referral:  Clinical Social Work  Discharge planning Services  CM Consult  Post Acute Care Choice:    Choice offered to:     DME Arranged:    DME Agency:     HH Arranged:    [place] Agency:     Status of Service:  In process, will continue to follow  If discussed at Long Length of Stay Meetings, dates discussed:    Additional Comments:  [name], RN 07/23/2018, 1:48 PM

## 2018-07-23 NOTE — Evaluation (Signed)
Physical Therapy Evaluation Patient Details Name: Audrey [name] MRN: [id number] DOB: [birth year] Today's Date: 07/23/2018   History of Present Illness  Audrey [name] is a 84 y.o. female with medical history significant of HTN, HLD, Asthma, IFG/Pre-Diabetes and other comorbidities who presents with a chief complaint of fall and immediate right knee pain. xray = right patella fx  Clinical Impression  Pt admitted with above diagnosis. Pt currently with functional limitations due to the deficits listed below (see PT Problem List). Pt requiring assist with all basic mobility; may need STSNF to allow safe return home;  Pt will benefit from skilled PT to increase their independence and safety with mobility to allow discharge to the venue listed below.       Follow Up Recommendations SNF (vs home with incr assist/HHPT)    Equipment Recommendations  None recommended by PT(RW if home)    Recommendations for Other Services       Precautions / Restrictions Precautions Precautions: Fall Required Braces or Orthoses: Knee Immobilizer - Right Knee Immobilizer - Right: On at all times Restrictions Other Position/Activity Restrictions: no orders for WBing--await clarification      Mobility  Bed Mobility Overal bed mobility: Needs Assistance Bed Mobility: Supine to Sit     Supine to sit: Min assist     General bed mobility comments: with RLE, incr time, heavily reliant on rail  Transfers Overall transfer level: Needs assistance Equipment used: Rolling walker (2 wheeled) Transfers: Sit to/from [facility] Sit to Stand: Min assist Stand pivot transfers: Min assist       General transfer comment: cues for hand placement, RLE management, incr time needed   Ambulation/Gait             General Gait Details: pivotal steps to [facility]  Stairs            Wheelchair Mobility    Modified Rankin (Stroke Patients Only)       Balance Overall balance  assessment: Needs assistance   Sitting balance-Leahy Scale: Fair       Standing balance-Leahy Scale: Poor Standing balance comment: reliant on UEs                             Pertinent Vitals/Pain Pain Assessment: Faces Faces Pain Scale: Hurts little more Pain Location: right knee with WBing Pain Descriptors / Indicators: Guarding;Grimacing Pain Intervention(s): Monitored during session    Home Living Family/patient expects to be discharged to:: Private residence Living Arrangements: Alone Available Help at Discharge: Available PRN/intermittently Type of Home: House Home Access: Stairs to enter   Technical [employer] of Steps: 3 Home Layout: One level Home Equipment: None Additional Comments: pt reports she has "caregiver" but she only checks on her, does need her for anything    Prior Function                 Hand Dominance        Extremity/Trunk Assessment   Upper Extremity Assessment Upper Extremity Assessment: Defer to OT evaluation    Lower Extremity Assessment Lower Extremity Assessment: RLE deficits/detail RLE Deficits / Details: limited by pain, hip flexion with knee extension 2-/5; ankle grossly WFL RLE: Unable to fully assess due to immobilization;Unable to fully assess due to pain       Communication   Communication: No difficulties  Cognition Arousal/Alertness: Awake/alert Behavior During Therapy: WFL for tasks assessed/performed Overall Cognitive Status: Within Functional Limits for tasks assessed  General Comments      Exercises     Assessment/Plan    PT Assessment Patient needs continued PT services  PT Problem List Decreased strength;Decreased activity tolerance;Decreased balance;Decreased knowledge of use of DME;Decreased mobility;Pain       PT Treatment Interventions DME instruction;Gait training;Functional mobility training;Therapeutic  activities;Therapeutic exercise;Patient/family education    PT Goals (Current goals can be found in the Care Plan section)  Acute Rehab PT Goals Time For Goal Achievement: 08/06/18 Potential to Achieve Goals: Good    Frequency Min 3X/week   Barriers to discharge        Co-evaluation               AM-PAC PT "6 Clicks" Daily Activity  Outcome Measure Difficulty turning over in bed (including adjusting bedclothes, sheets and blankets)?: Unable Difficulty moving from lying on back to sitting on the side of the bed? : Unable Difficulty sitting down on and standing up from a chair with arms (e.g., wheelchair, bedside commode, etc,.)?: Unable Help needed moving to and from a bed to chair (including a wheelchair)?: A Lot Help needed walking in hospital room?: A Lot Help needed climbing 3-5 steps with a railing? : A Lot 6 Click Score: 9    End of Session Equipment Utilized During Treatment: Gait belt;Right knee immobilizer Activity Tolerance: Patient limited by fatigue;Patient limited by pain Patient left: Other (comment);with call bell/phone within reach(BSC, NT aware)   PT Visit Diagnosis: Difficulty in walking, not elsewhere classified (R26.2)    Time: 1012-1029 PT Time Calculation (min) (ACUTE ONLY): 17 min   Charges:   PT Evaluation $PT Eval Low Complexity: 1 Low          Kenyon Ana, PT  Pager: [phone] Acute Rehab Dept [facility]): [phone]   07/23/2018   [facility] 07/23/2018, 10:39 AM

## 2018-07-23 NOTE — Progress Notes (Signed)
  Echocardiogram 2D Echocardiogram has been performed.  Patient informed me she does not feel comfortable in side.   Audrey [name] L Androw 07/23/2018, 9:32 AM

## 2018-07-23 NOTE — Progress Notes (Signed)
PROGRESS NOTE    Audrey [name]  JTT:[id number] DOB: 03-10-1933 DOA: 07/22/2018 PCP: Wenda Low, MD  Brief Narrative:  HPI: Audrey [name] is a 84 y.o. female with medical history significant of HTN, HLD, Asthma, IFG/Pre-Diabetes and other comorbidities who presents with a chief complaint of fall and immediate right knee pain.  Patient was in her normal state of health this morning she was ambulating on her way to church between 930 and 10:00 she and up falling and was unable to get up.  Upon her fall she had immediate pain was sharp at 9 out of 10 in severity nonradiating.  Patient states that initially she felt dizziness and fell to the floor but did not actually pass out.  No nausea or vomiting.  Denies any chest pain, lightheadedness or dizziness.  No other concerns or complaints at this time and [facility] was called to admit this patient for presyncope and right knee pain.  Orthopedics Dr. Tamera Punt was consulted by the EDP and they recommended nonoperative management  ED Course: Patient had basic blood work done, given pain control, and had imaging of her hip, knee and femur.  **She improved Pain wise and PT recommending SNF. Social Work consulted for assistance with placement.   Assessment & Plan:   Active Problems:   Patellar fracture   Asthma   HLD (hyperlipidemia)   Pre-diabetes   Essential hypertension   Pre-syncope   Dizziness  Pre-Syncope and Dizziness, improving  -Place in observation telemetry -Cycle cardiac troponins to rule out ischemic causes and initial was <0.03 -Continue Telemetry monitoring -Check Echocardiogram and done and pending read -Check orthostatic vital signs and still pending to be done -Gentle IV fluid hydration with normal saline rate to 75 mils per hour -PT/OT to evaluate and treat and recommending SNF -Checked TSH and was 3.233 -Check urinalysis and urine culture; U/A was Negative -Check CT of the Head w/o Contrast and showed No evidence of acute  intracranial abnormality; Atrophy and chronic small-vessel white matter ischemic changes. -Follow-up on PT recommendations and they recommend SNF as patient lives alone.   Acute Patellar Fracture -PT/OT to evaluate and treat and recommneding SNF -Orthopedics was consulted by the EDP and recommends nonoperative management -Continue with pain control with acetaminophen, hydrocodone and as needed Fentanyl -Pain Level was 2/10 this AM -C/w Bowel Regimen as patient is on Pain Meds -Patient is agreeable to skilled nursing facility so I have notified the social worker to assist with placement  Hypertension -Blood pressure likely elevated in the setting of pain and now was 151/65 -Continue home Amlodipine -Will add PRN Hydralazine  HLD -Continue home Lovastatin substitution with Pravastatin and omega-3 fish oil  Asthma -Continue PRN albuterol nebs  Hyperglycemia in the setting of Pre-Diabetes/IFG -Check Hemoglobin A1c and was 6.0 -CBG's ranging from 102-151 -Continue monitor CBGs and place on sensitive NovoLog sliding scale insulin  Osteoporosis -Takes of Fosamax as an outpatient -May need vitamin D and calcium supplementation but will defer to PCP in the outpatient setting   Obesity -Estimated body mass index is 31.28 kg/m as calculated from the following:   Height as of this encounter: 5\' 3"  (1.6 m).   Weight as of this encounter: 80.1 kg. -Weight Loss Counseling given   DVT prophylaxis: Lovenox 40 mg sq q24h Code Status: FULL CODE Family Communication: No family present at bedside  Disposition Plan: SNF  Consultants:   None   Procedures:  ECHOCARDIOGRAM done and pending read   Antimicrobials:  Anti-infectives (From  admission, onward)   None     Subjective: Seen and examined at bedside she is about disposition.  No chest pain, lightheadedness or dizziness.  States knee pain has improved and states is just sore.  Describes the pain as 2 out of 10 in severity  today.  Also had a bowel movement earlier this morning and felt a little bit better.  No other concerns or complaints at this time  Objective: Vitals:   07/23/18 0231 07/23/18 0500 07/23/18 0836 07/23/18 1327  BP: (!) 156/69  (!) 166/64 (!) 151/65  Pulse: 76   79  Resp: 16   16  Temp: 98.7 F (37.1 C)   (!) 97.5 F (36.4 C)  TempSrc: Oral   Oral  SpO2: 97%   100%  Weight:  80.1 kg    Height:        Intake/Output Summary (Last 24 hours) at 07/23/2018 1411 Last data filed at 07/23/2018 0300 Gross per 24 hour  Intake 727.97 ml  Output -  Net 727.97 ml   Filed Weights   07/22/18 1701 07/23/18 0500  Weight: 80.9 kg 80.1 kg   Examination: Physical Exam:  Constitutional: WN/WD obese AAF in NAD and appears calm and comfortable Eyes: Lids and conjunctivae normal, sclerae anicteric; Has some Arcus Senilis  ENMT: External Ears, Nose appear normal. Grossly normal hearing. Mucous membranes are moist.   Neck: Appears normal, supple, no cervical masses, normal ROM, no appreciable thyromegaly; no JVD Respiratory: Diminished to auscultation bilaterally, no wheezing, rales, rhonchi or crackles. Normal respiratory effort and patient is not tachypenic. No accessory muscle use.  Cardiovascular: RRR, no murmurs / rubs / gallops. S1 and S2 auscultated. No appreciable extremity edema. Abdomen: Soft, non-tender, Distended 2/2 body habitus. No masses palpated. No appreciable hepatosplenomegaly. Bowel sounds positive x4.  GU: Deferred. Musculoskeletal: No clubbing / cyanosis of digits/nails. Right Leg and Knee immobilized  Skin: No rashes, lesions, ulcers on a limited skin evaluation. No induration; Warm and dry.  Neurologic: CN 2-12 grossly intact with no focal deficits. Romberg sign and cerebellar reflexes not assessed.  Psychiatric: Normal judgment and insight. Alert and oriented x 3. Slightly anxious mood and appropriate affect.   Data Reviewed: I have personally reviewed following labs and  imaging studies  CBC: Recent Labs  Lab 07/22/18 1442 07/23/18 0541  WBC 8.6 8.8  NEUTROABS 4.5  --   HGB 13.2 12.3  HCT 42.9 40.0  MCV 96.8 98.8  PLT 279 366   Basic Metabolic Panel: Recent Labs  Lab 07/22/18 1442 07/23/18 0541  NA 140 141  K 3.6 3.8  CL 103 108  CO2 28 24  GLUCOSE 102* 138*  BUN 18 13  CREATININE 0.79 0.93  CALCIUM 9.7 9.0   GFR: Estimated Creatinine Clearance: 45.9 mL/min (by C-G formula based on SCr of 0.93 mg/dL). Liver Function Tests: Recent Labs  Lab 07/23/18 0541  AST 17  ALT 11  ALKPHOS 46  BILITOT 0.9  PROT 6.9  ALBUMIN 3.5   No results for input(s): LIPASE, AMYLASE in the last 168 hours. No results for input(s): AMMONIA in the last 168 hours. Coagulation Profile: No results for input(s): INR, PROTIME in the last 168 hours. Cardiac Enzymes: Recent Labs  Lab 07/23/18 0826  TROPONINI <0.03   BNP (last 3 results) No results for input(s): PROBNP in the last 8760 hours. HbA1C: Recent Labs    07/23/18 0541  HGBA1C 6.0*   CBG: Recent Labs  Lab 07/23/18 0821  GLUCAP 151*  Lipid Profile: No results for input(s): CHOL, HDL, LDLCALC, TRIG, CHOLHDL, LDLDIRECT in the last 72 hours. Thyroid Function Tests: Recent Labs    07/22/18 1712  TSH 3.233   Anemia Panel: No results for input(s): VITAMINB12, FOLATE, FERRITIN, TIBC, IRON, RETICCTPCT in the last 72 hours. Sepsis Labs: No results for input(s): PROCALCITON, LATICACIDVEN in the last 168 hours.  No results found for this or any previous visit (from the past 240 hour(s)).   Radiology Studies: Ct Head Wo Contrast  Result Date: 07/23/2018 CLINICAL DATA:  84 year old female with acute fall yesterday with dizziness. EXAM: CT HEAD WITHOUT CONTRAST TECHNIQUE: Contiguous axial images were obtained from the base of the skull through the vertex without intravenous contrast. COMPARISON:  12/28/2007 FINDINGS: Brain: No evidence of acute infarction, hemorrhage, hydrocephalus,  extra-axial collection or mass lesion/mass effect. Atrophy and chronic small-vessel white matter ischemic changes again noted. Vascular: Carotid atherosclerotic calcifications again noted. Skull: Normal. Negative for fracture or focal lesion. Sinuses/Orbits: No acute finding. Other: None. IMPRESSION: 1. No evidence of acute intracranial abnormality 2. Atrophy and chronic small-vessel white matter ischemic changes. Electronically Signed   By: Margarette Canada M.D.   On: 07/23/2018 13:23   Dg Knee Complete 4 Views Right  Result Date: 07/22/2018 CLINICAL DATA:  Right knee pain after fall. EXAM: RIGHT KNEE - COMPLETE 4+ VIEW COMPARISON:  None. FINDINGS: Moderately displaced and comminuted fracture is seen involving the patella. The visualized femur, tibia and fibula appear intact. Joint spaces are unremarkable. Mild suprapatellar joint effusion is noted. IMPRESSION: Comminuted and displaced patellar fracture. Electronically Signed   By: Marijo Conception, M.D.   On: 07/22/2018 13:21   Dg Hip Unilat W Or Wo Pelvis 2-3 Views Right  Result Date: 07/22/2018 CLINICAL DATA:  Fall. EXAM: DG HIP (WITH OR WITHOUT PELVIS) 2-3V RIGHT COMPARISON:  None. FINDINGS: There is no evidence of hip fracture or dislocation. There is no evidence of arthropathy or other focal bone abnormality. IMPRESSION: Negative. Electronically Signed   By: Marijo Conception, M.D.   On: 07/22/2018 13:22   Dg Femur Min 2 Views Right  Result Date: 07/22/2018 CLINICAL DATA:  Fall. EXAM: RIGHT FEMUR 2 VIEWS COMPARISON:  None. FINDINGS: Patellar fracture is again noted.  The femur appears normal. IMPRESSION: Moderately displaced patellar fracture is noted. Femur appears normal. Electronically Signed   By: Marijo Conception, M.D.   On: 07/22/2018 13:23   Scheduled Meds: . acetaminophen  650 mg Oral Once  . amLODipine  5 mg Oral Daily  . enoxaparin (LOVENOX) injection  40 mg Subcutaneous Q24H  . fentaNYL (SUBLIMAZE) injection  25 mcg Intravenous Once  .  omega-3 acid ethyl esters  1 g Oral Daily  . pravastatin  40 mg Oral q1800   Continuous Infusions: . dextrose 5 % and 0.9% NaCl 75 mL/hr at 07/23/18 0958    LOS: 0 days   Kerney Elbe, DO [facility] PAGER is on [place]  If 7PM-7AM, please contact night-coverage [email or web address] Password [email or web address] 07/23/2018, 2:11 PM

## 2018-07-24 DIAGNOSIS — D649 Anemia, unspecified: Secondary | ICD-10-CM

## 2018-07-24 LAB — CBC WITH DIFFERENTIAL/PLATELET
Abs Immature Granulocytes: 0.08 10*3/uL — ABNORMAL HIGH (ref 0.00–0.07)
BASOS ABS: 0.1 10*3/uL (ref 0.0–0.1)
Basophils Relative: 1 %
EOS PCT: 2 %
Eosinophils Absolute: 0.2 10*3/uL (ref 0.0–0.5)
HCT: 37.2 % (ref 36.0–46.0)
HEMOGLOBIN: 11.2 g/dL — AB (ref 12.0–15.0)
IMMATURE GRANULOCYTES: 1 %
Lymphocytes Relative: 41 %
Lymphs Abs: 3 10*3/uL (ref 0.7–4.0)
MCH: 29.7 pg (ref 26.0–34.0)
MCHC: 30.1 g/dL (ref 30.0–36.0)
MCV: 98.7 fL (ref 80.0–100.0)
Monocytes Absolute: 0.9 10*3/uL (ref 0.1–1.0)
Monocytes Relative: 12 %
NEUTROS PCT: 43 %
NRBC: 0 % (ref 0.0–0.2)
Neutro Abs: 3.1 10*3/uL (ref 1.7–7.7)
PLATELETS: 233 10*3/uL (ref 150–400)
RBC: 3.77 MIL/uL — ABNORMAL LOW (ref 3.87–5.11)
RDW: 13.9 % (ref 11.5–15.5)
WBC: 7.2 10*3/uL (ref 4.0–10.5)

## 2018-07-24 LAB — COMPREHENSIVE METABOLIC PANEL
ALK PHOS: 48 U/L (ref 38–126)
ALT: 11 U/L (ref 0–44)
ANION GAP: 7 (ref 5–15)
AST: 14 U/L — ABNORMAL LOW (ref 15–41)
Albumin: 3.4 g/dL — ABNORMAL LOW (ref 3.5–5.0)
BUN: 11 mg/dL (ref 8–23)
CALCIUM: 8.9 mg/dL (ref 8.9–10.3)
CO2: 25 mmol/L (ref 22–32)
Chloride: 109 mmol/L (ref 98–111)
Creatinine, Ser: 0.74 mg/dL (ref 0.44–1.00)
GFR calc non Af Amer: >60 mL/min (ref >60)
Glucose, Bld: 135 mg/dL — ABNORMAL HIGH (ref 70–99)
Potassium: 3.8 mmol/L (ref 3.5–5.1)
SODIUM: 141 mmol/L (ref 135–145)
Total Bilirubin: 0.9 mg/dL (ref 0.3–1.2)
Total Protein: 6.3 g/dL — ABNORMAL LOW (ref 6.5–8.1)

## 2018-07-24 LAB — URINE CULTURE

## 2018-07-24 LAB — GLUCOSE, CAPILLARY: Glucose-Capillary: 116 mg/dL — ABNORMAL HIGH (ref 70–99)

## 2018-07-24 LAB — PHOSPHORUS: PHOSPHORUS: 3.5 mg/dL (ref 2.5–4.6)

## 2018-07-24 LAB — MAGNESIUM: Magnesium: 1.8 mg/dL (ref 1.7–2.4)

## 2018-07-24 MED ORDER — HYDROCODONE-ACETAMINOPHEN 5-325 MG PO TABS: 1.0000 | ORAL_TABLET | ORAL | 0 refills | Status: DC | PRN

## 2018-07-24 MED ORDER — ACETAMINOPHEN 325 MG PO TABS: 650.0000 mg | ORAL_TABLET | Freq: Four times a day (QID) | ORAL | Status: DC | PRN

## 2018-07-24 MED ORDER — SENNOSIDES-DOCUSATE SODIUM 8.6-50 MG PO TABS: 1.0000 | ORAL_TABLET | Freq: Every evening | ORAL | Status: DC | PRN

## 2018-07-24 NOTE — Progress Notes (Signed)
PHYSICAL THERAPY  Per MD ED note on 10/23  "Patient has diffuse tenderness to the right patellar area.  Otherwise has normal strength and sensation.  No concern for quadricep tendon injury.  X-rays confirmed displaced and comminuted patellar fracture.  Discussed the case with Dr. Tamera Punt with orthopedics who recommend weightbearing as tolerated while in a knee immobilizer.  This will be nonoperative and patient can follow-up with him"  Rica Koyanagi  PTA Acute  Rehabilitation Services Pager      [phone] Office      [phone]

## 2018-07-24 NOTE — Progress Notes (Signed)
Physical Therapy Treatment Patient Details Name: Audrey [name] MRN: [id number] DOB: [birth year] Today's Date: 07/24/2018    History of Present Illness [name] is a 84 y.o. female with medical history significant of HTN, HLD, Asthma, IFG/Pre-Diabetes and other comorbidities who presents with a chief complaint of fall and immediate right knee pain. xray = right patella fx    PT Comments    Pt OOB in recliner.  Educated on [place] use "at all times" can amb WBAT.  Educated on proper fit.  Assisted with amb a short distance to bathroom.  Assisted in bathroom with turns and R LE assist.  Assist back to recliner.    Follow Up Recommendations  SNF     Equipment Recommendations       Recommendations for Other Services       Precautions / Restrictions Precautions Precautions: Fall Required Braces or Orthoses: Knee Immobilizer - Right Knee Immobilizer - Right: On at all times Restrictions Weight Bearing Restrictions: No Other Position/Activity Restrictions: per ED MD PN pt WBAT    Mobility  Bed Mobility               General bed mobility comments: OOB in recliner   Transfers Overall transfer level: Needs assistance Equipment used: Rolling walker (2 wheeled) Transfers: Sit to/from [facility] Sit to Stand: Min assist Stand pivot transfers: Min assist       General transfer comment: cues for hand placement, assist RLE management, incr time needed   Ambulation/Gait Ambulation/Gait assistance: Min assist;Mod assist Gait Distance (Feet): 12 Feet(to and from bathroom) Assistive device: Rolling walker (2 wheeled) Gait Pattern/deviations: Step-to pattern;Step-through pattern;Decreased stance time - right Gait velocity: decreased    General Gait Details: very unsteady gait with limited distance   Stairs             Wheelchair Mobility    Modified Rankin (Stroke Patients Only)       Balance                                             Cognition Arousal/Alertness: Awake/alert Behavior During Therapy: WFL for tasks assessed/performed Overall Cognitive Status: Within Functional Limits for tasks assessed                                 General Comments: pleasant and smart.  Following all directions/instructions and asking good questions       Exercises      General Comments        Pertinent Vitals/Pain Pain Assessment: No/denies pain    Home Living                      Prior Function            PT Goals (current goals can now be found in the care plan section) Progress towards PT goals: Progressing toward goals    Frequency    Min 3X/week      PT Plan Current plan remains appropriate    Co-evaluation              AM-PAC PT "6 Clicks" Daily Activity  Outcome Measure  Difficulty turning over in bed (including adjusting bedclothes, sheets and blankets)?: Unable Difficulty moving from lying on back to sitting on the side of the  bed? : Unable Difficulty sitting down on and standing up from a chair with arms (e.g., wheelchair, bedside commode, etc,.)?: Unable Help needed moving to and from a bed to chair (including a wheelchair)?: A Lot Help needed walking in hospital room?: A Lot Help needed climbing 3-5 steps with a railing? : A Lot 6 Click Score: 9    End of Session Equipment Utilized During Treatment: Gait belt;Right knee immobilizer Activity Tolerance: Patient limited by fatigue Patient left: in chair;with call bell/phone within reach Nurse Communication: Mobility status PT Visit Diagnosis: Difficulty in walking, not elsewhere classified (R26.2)     Time: 7703-4035 PT Time Calculation (min) (ACUTE ONLY): 25 min  Charges:  $Gait Training: 8-22 mins $Therapeutic Activity: 8-22 mins                     Rica Koyanagi  PTA Acute  Rehabilitation Services Pager      [phone] Office      [phone]

## 2018-07-24 NOTE — Progress Notes (Addendum)
CSW consulted to assist with disposition- recommended to go to SNF for short term rehab. Met with pt at bedside- she states she lives alone, fell at home PTA. In agreement she could benefit from rehab as she does not have help at home and is requiring assistance with ambulating and ADLs, states she anticipates being able to return closer to prior functioning and return home, was "doing really well before this happened." Requests [facility] referral- CSW obtained pasrr, completed FL2, and spoke with admissions, insurance auth request initiated.  Sharren Bridge, MSW, LCSW Clinical Social Work 07/24/2018 [phone] coverage for [phone]

## 2018-07-24 NOTE — [place] FL2 (Signed)
[place] MEDICAID FL2 LEVEL OF CARE SCREENING TOOL     IDENTIFICATION  Patient Name: Audrey [name] Birthdate: [birth year] Sex: female Admission Date (Current Location): 07/22/2018  [facility] and [place] Number:  Herbalist and Address:  [facility],  [address] [place], [place]      Provider Number: [id number]  Attending Physician Name and Address:  Kerney Elbe, DO  Relative Name and Phone Number:       Current Level of Care: Hospital Recommended Level of Care: [place] Prior Approval Number:    Date Approved/Denied:   PASRR Number: [id number]  Discharge Plan: SNF    Current Diagnoses: Patient Active Problem List   Diagnosis Date Noted  . Patellar fracture 07/22/2018  . Asthma 07/22/2018  . HLD (hyperlipidemia) 07/22/2018  . Pre-diabetes 07/22/2018  . Essential hypertension 07/22/2018  . Pre-syncope 07/22/2018  . Dizziness 07/22/2018  . Status asthmaticus 11/18/2013  . Asthma with acute exacerbation 10/09/2012    Orientation RESPIRATION BLADDER Height & Weight     Self, Time, Situation, Place  Normal Continent, External catheter Weight: 179 lb 0.2 oz (81.2 kg) Height:  5\' 3"  (160 cm)  BEHAVIORAL SYMPTOMS/MOOD NEUROLOGICAL BOWEL NUTRITION STATUS      Continent Diet(carb modified diet)  AMBULATORY STATUS COMMUNICATION OF NEEDS Skin   Extensive Assist Verbally Normal                       Personal Care Assistance Level of Assistance  Bathing, Feeding, Dressing Bathing Assistance: Maximum assistance Feeding assistance: Independent Dressing Assistance: Maximum assistance     Functional Limitations Info  Sight, Hearing, Speech Sight Info: Adequate Hearing Info: Adequate Speech Info: Adequate    SPECIAL CARE FACTORS FREQUENCY  PT (By licensed PT), OT (By licensed OT)     PT Frequency: 5x OT Frequency: 5x            Contractures Contractures Info: Not present    Additional Factors  Info  Code Status, Allergies Code Status Info: full code Allergies Info: penicillin           Current Medications (07/24/2018):  This is the current hospital active medication list Current Facility-Administered Medications  Medication Dose Route Frequency Provider Last Rate Last Dose  . acetaminophen (TYLENOL) tablet 650 mg  650 mg Oral Q6H PRN Raiford Noble Latif, DO       Or  . acetaminophen (TYLENOL) suppository 650 mg  650 mg Rectal Q6H PRN Raiford Noble Latif, DO      . acetaminophen (TYLENOL) tablet 650 mg  650 mg Oral Once Sheikh, Omair Latif, DO      . albuterol (PROVENTIL) (2.5 MG/3ML) 0.083% nebulizer solution 2.5 mg  2.5 mg Inhalation TID PRN Alfredia Ferguson, Omair Latif, DO      . amLODipine (NORVASC) tablet 5 mg  5 mg Oral Daily Raiford Noble [place], DO   5 mg at 07/24/18 0845  . enoxaparin (LOVENOX) injection 40 mg  40 mg Subcutaneous Q24H SheikhGeorgina Quint [place], DO   40 mg at 07/23/18 1729  . fentaNYL (SUBLIMAZE) injection 25 mcg  25 mcg Intravenous Once [place], Georgina Quint [place], DO   Stopped at 07/22/18 1440  . fentaNYL (SUBLIMAZE) injection 25 mcg  25 mcg Intravenous Q2H PRN Sheikh, Georgina Quint [place], DO      . HYDROcodone-acetaminophen (NORCO/VICODIN) 5-325 MG per tablet 1-2 tablet  1-2 tablet Oral Q4H PRN Raiford Noble Latif, DO      . omega-3 acid ethyl  esters (LOVAZA) capsule 1 g  1 g Oral Daily Raiford Noble [place], DO   1 g at 07/24/18 0845  . ondansetron (ZOFRAN) tablet 4 mg  4 mg Oral Q6H PRN Raiford Noble Latif, DO       Or  . ondansetron [facility]) injection 4 mg  4 mg Intravenous Q6H PRN Sheikh, Georgina Quint Latif, DO      . pravastatin (PRAVACHOL) tablet 40 mg  40 mg Oral [address] [place], DO   40 mg at 07/23/18 1729  . senna-docusate (Senokot-S) tablet 1 tablet  1 tablet Oral QHS PRN Kerney Elbe, DO         Discharge Medications: Please see discharge summary for a list of discharge medications.  Relevant Imaging Results:  Relevant Lab Results:   Additional  Information SS# [id number]  Nila Nephew, LCSW

## 2018-07-24 NOTE — Care Management Note (Signed)
Case Management Note  Patient Details  Name: Audrey [name] MRN: [id number] Date of Birth: [birth year]  Subjective/Objective:  D/c SNF. CSW following.                  Action/Plan:d/c SNF.   Expected Discharge Date:  07/24/18               Expected Discharge Plan:  Skilled Nursing Facility  In-House Referral:  Clinical Social Work  Discharge planning Services  CM Consult  Post Acute Care Choice:    Choice offered to:     DME Arranged:    DME Agency:     HH Arranged:    [place] Agency:     Status of Service:  Completed, signed off  If discussed at [employer], dates discussed:    Additional Comments:  [name], RN 07/24/2018, 2:04 PM

## 2018-07-24 NOTE — Discharge Summary (Signed)
Physician Discharge Summary  Audrey [name] FBP:[id number] DOB: [birth year] DOA: 07/22/2018  PCP: Wenda Low, MD  Admit date: 07/22/2018 Discharge date: 07/24/2018  Admitted From: Home Disposition: SNF  Recommendations for Outpatient Follow-up:  1. Follow up with PCP in 1-2 weeks 2. Follow up with Cardiology as an outpatient for Event Monitor 3. Please obtain CMP/CBC, Mag, Phos in one week 4. Please follow up on the following pending results:  Home Health: No Equipment/Devices: None Recommended by PT    Discharge Condition: Stable CODE STATUS: FULL CODE Diet recommendation: Heart Healthy Carb Modified  Brief/Interim Summary: OEU:MPNTIRW W Wileyis a 84 y.o.femalewith medical history significant ofHTN, HLD, Asthma, IFG/Pre-Diabetesand other comorbidities who presents with a chief complaint of fall and immediate right knee pain. Patient was in her normal state of health this morning she was ambulating on her way to church between 930 and 10:00 she and up falling and was unable to get up. Upon her fall she had immediate pain was sharp at 9 out of 10 in severity nonradiating. Patient states that initially she felt dizziness and fell to the floor but did not actually pass out. No nausea or vomiting. Denies any chest pain, lightheadedness or dizziness. No other concerns or complaints at this time and [facility] was called to admit this patient for presyncope and right knee pain. Orthopedics Dr. Tamera Punt was consulted by the EDP and they recommended nonoperative management  ED Course:Patient had basic blood work done, given pain control, and had imaging of her hip, knee and femur.  **She improved Pain wise and PT recommending SNF. Social Work consulted for assistance with placement. She is deemed medically stable to D/C and awaiting SNF bed placement.   Discharge Diagnoses:  Active Problems:   Patellar fracture   Asthma   HLD (hyperlipidemia)   Pre-diabetes   Essential  hypertension   Pre-syncope   Dizziness  Pre-Syncope and Dizziness, improving  -Place in observation telemetry -Cycled cardiac troponins to rule out ischemic causes and they were <0.03 x3 -Continue Telemetry monitoring -Check Echocardiogram and done showed Normal LV EF without wall motion abnormalities. Very mild aortic   stenosis, with a component likely due to LVOT -Check orthostatic vital signs; She was orthostatic Yesterday as BP dropped but will recheck again today -Gentle IV fluid hydration with D5W normal saline at a rate of 75 mL/hr now stopped  -PT/OT to evaluate and treat and recommending SNF -Checked TSH and was 3.233 -Check urinalysis and urine culture; U/A was Negative -Check CT of the Head w/o Contrast and showed No evidence of acute intracranial abnormality; Atrophy and chronic small-vessel white matter ischemic changes. -Follow-up on PT recommendations and they recommend SNF as patient lives alone.   Acute Patellar Fracture -PT/OT to evaluate and treat and recommneding SNF -Orthopedics was consulted by the EDP and recommends nonoperative management -Continue with pain control with acetaminophen,hydrocodone and as neededFentanyl -Pain Level was improved -C/w Bowel Regimen as patient is on Pain Meds -Patient is agreeable to skilled nursing facility so I have notified the social worker to assist with placement -Medically stable to D/C   Hypertension -Blood pressure likely elevated in the setting of pain and now was 157/79 -Continue home Amlodipine -Added PRN Hydralazine  HLD -Continue home Lovastatin substitution with Pravastatin while hospitalized and Omega-3 fish oil -C/w Home Lovstatin at D/C   Asthma -Continue PRN albuterol nebs  Hyperglycemia in the setting of Pre-Diabetes/IFG -Check Hemoglobin A1c and was 6.0 -CBG's ranging from 116-151 -Continue monitor CBGs and place on  sensitive NovoLog sliding scale insulin if necessary   Osteoporosis -Takes  of Fosamax as an outpatient -May need vitamin D and calcium supplementation but will defer to PCP in the outpatient setting   Obesity -Estimated body mass index is 31.28 kg/m as calculated from the following:   Height as of this encounter: 5\' 3"  (1.6 m).   Weight as of this encounter: 80.1 kg. -Weight Loss Counseling given   Normocytic Anemia -Patient's hemoglobin/hematocrit dropped from 13.2/42.9 and then went to 12.3/40.09 was 11.2/37.2 -Likely dilutional drop from IV fluid hydration and IV fluids and nonstop -Continue monitor for signs and symptoms he is not currently has no active signs of bleeding -Outpatient further work-up with anemia panel with PCP -Continue monitor and repeat CBC in the outpatient setting  Discharge Instructions Discharge Instructions    Call MD for:  difficulty breathing, headache or visual disturbances   Complete by:  As directed    Call MD for:  extreme fatigue   Complete by:  As directed    Call MD for:  hives   Complete by:  As directed    Call MD for:  persistant dizziness or light-headedness   Complete by:  As directed    Call MD for:  persistant nausea and vomiting   Complete by:  As directed    Call MD for:  redness, tenderness, or signs of infection (pain, swelling, redness, odor or green/yellow discharge around incision site)   Complete by:  As directed    Call MD for:  severe uncontrolled pain   Complete by:  As directed    Call MD for:  temperature >100.4   Complete by:  As directed    Diet - low sodium heart healthy   Complete by:  As directed    Diet Carb Modified   Complete by:  As directed    Discharge instructions   Complete by:  As directed    You were cared for by a hospitalist during your hospital stay. If you have any questions about your discharge medications or the care you received while you were in the hospital after you are discharged, you can call the unit and ask to speak with the hospitalist on call if the hospitalist  that took care of you is not available. Once you are discharged, your primary care physician will handle any further medical issues. Please note that NO REFILLS for any discharge medications will be authorized once you are discharged, as it is imperative that you return to your primary care physician (or establish a relationship with a primary care physician if you do not have one) for your aftercare needs so that they can reassess your need for medications and monitor your lab values.  Follow up with PCP and Orthopedic Surgery in the outpatient setting. Take all medications as prescribed. If symptoms change or worsen please return to the ED for evaluation   Increase activity slowly   Complete by:  As directed      Allergies as of 07/24/2018      Reactions   Penicillins Other (See Comments)   Hallucinations Has patient had a PCN reaction causing immediate rash, facial/tongue/throat swelling, SOB or lightheadedness with hypotension:No Has patient had a PCN reaction causing severe rash involving mucus membranes or skin necrosis:No Has patient had a PCN reaction that required hospitalization:No Has patient had a PCN reaction occurring within the last 10 years:NO If all of the above answers are "NO", then may proceed with Cephalosporin use.  Medication List    STOP taking these medications   predniSONE 20 MG tablet Commonly known as:  DELTASONE     TAKE these medications   acetaminophen 325 MG tablet Commonly known as:  TYLENOL Take 2 tablets (650 mg total) by mouth every 6 (six) hours as needed for mild pain (or Fever >/= 101).   albuterol 108 (90 Base) MCG/ACT inhaler Commonly known as:  PROVENTIL HFA;VENTOLIN HFA Inhale 2 puffs into the lungs every 6 (six) hours as needed for shortness of breath.   albuterol (2.5 MG/3ML) 0.083% nebulizer solution Commonly known as:  PROVENTIL Inhale 2.5 mg into the lungs 3 (three) times daily as needed for shortness of breath.   alendronate  70 MG tablet Commonly known as:  FOSAMAX Take 70 mg by mouth every Wednesday.   amLODipine 5 MG tablet Commonly known as:  NORVASC Take 5 mg by mouth daily.   HYDROcodone-acetaminophen 5-325 MG tablet Commonly known as:  NORCO/VICODIN Take 1-2 tablets by mouth every 4 (four) hours as needed for moderate pain.   lovastatin 40 MG tablet Commonly known as:  MEVACOR Take 40 mg by mouth daily.   omega-3 acid ethyl esters 1 g capsule Commonly known as:  LOVAZA Take 1 g by mouth daily. Reported on 04/14/2016   OVER THE COUNTER MEDICATION Take 1 tablet by mouth daily. brain support vitamin   senna-docusate 8.6-50 MG tablet Commonly known as:  Senokot-S Take 1 tablet by mouth at bedtime as needed for mild constipation.   triamcinolone cream 0.5 % Commonly known as:  KENALOG Apply 1 application topically 2 (two) times daily as needed (itchy skin).       Allergies  Allergen Reactions  . Penicillins Other (See Comments)    Hallucinations Has patient had a PCN reaction causing immediate rash, facial/tongue/throat swelling, SOB or lightheadedness with hypotension:No Has patient had a PCN reaction causing severe rash involving mucus membranes or skin necrosis:No Has patient had a PCN reaction that required hospitalization:No Has patient had a PCN reaction occurring within the last 10 years:NO If all of the above answers are "NO", then may proceed with Cephalosporin use.    Consultations:  Case was discussed with Orthopedic Surgery Dr. Tamera Punt by EDP  Procedures/Studies: Ct Head Wo Contrast  Result Date: 07/23/2018 CLINICAL DATA:  84 year old female with acute fall yesterday with dizziness. EXAM: CT HEAD WITHOUT CONTRAST TECHNIQUE: Contiguous axial images were obtained from the base of the skull through the vertex without intravenous contrast. COMPARISON:  12/28/2007 FINDINGS: Brain: No evidence of acute infarction, hemorrhage, hydrocephalus, extra-axial collection or mass  lesion/mass effect. Atrophy and chronic small-vessel white matter ischemic changes again noted. Vascular: Carotid atherosclerotic calcifications again noted. Skull: Normal. Negative for fracture or focal lesion. Sinuses/Orbits: No acute finding. Other: None. IMPRESSION: 1. No evidence of acute intracranial abnormality 2. Atrophy and chronic small-vessel white matter ischemic changes. Electronically Signed   By: Margarette Canada M.D.   On: 07/23/2018 13:23   Dg Knee Complete 4 Views Right  Result Date: 07/22/2018 CLINICAL DATA:  Right knee pain after fall. EXAM: RIGHT KNEE - COMPLETE 4+ VIEW COMPARISON:  None. FINDINGS: Moderately displaced and comminuted fracture is seen involving the patella. The visualized femur, tibia and fibula appear intact. Joint spaces are unremarkable. Mild suprapatellar joint effusion is noted. IMPRESSION: Comminuted and displaced patellar fracture. Electronically Signed   By: Marijo Conception, M.D.   On: 07/22/2018 13:21   Dg Hip Unilat W Or Wo Pelvis 2-3 Views Right  Result  Date: 07/22/2018 CLINICAL DATA:  Fall. EXAM: DG HIP (WITH OR WITHOUT PELVIS) 2-3V RIGHT COMPARISON:  None. FINDINGS: There is no evidence of hip fracture or dislocation. There is no evidence of arthropathy or other focal bone abnormality. IMPRESSION: Negative. Electronically Signed   By: Marijo Conception, M.D.   On: 07/22/2018 13:22   Dg Femur Min 2 Views Right  Result Date: 07/22/2018 CLINICAL DATA:  Fall. EXAM: RIGHT FEMUR 2 VIEWS COMPARISON:  None. FINDINGS: Patellar fracture is again noted.  The femur appears normal. IMPRESSION: Moderately displaced patellar fracture is noted. Femur appears normal. Electronically Signed   By: Marijo Conception, M.D.   On: 07/22/2018 13:23   ECHOCARDIOGRAM 07/23/18   ------------------------------------------------------------------- Study Conclusions  - Left ventricle: The cavity size was normal. Wall thickness was   normal. Systolic function was normal. The  estimated ejection   fraction was in the range of 55% to 60%. Wall motion was normal;   there were no regional wall motion abnormalities. - Aortic valve: Transvalvular velocity was minimally increased.   There was very mild stenosis. Valve area (Vmean): 1.35 cm^2. - Mitral valve: There was no regurgitation. Valve area by pressure   half-time: 2.29 cm^2. - Right ventricle: Systolic function was mildly reduced. - Atrial septum: No defect or patent foramen ovale was identified. - Tricuspid valve: There was trivial regurgitation.  Impressions:  - Normal LV EF without wall motion abnormalities. Very mild aortic   stenosis, with a component likely due to LVOT.  Subjective: Seen and examined at bedside and was doing well.  Denied any pain today.  No chest pain, lightheadedness or dizziness.  States that she is ablating well with therapy.  Was reluctant to go to skilled nursing facility but was agreeable and knew that this was her best option for recovery.  No other concerns or complaints at this time and patient medically stable for discharge  Discharge Exam: Vitals:   07/23/18 2105 07/24/18 0515  BP: (!) 160/73 (!) 157/79  Pulse: 72 79  Resp: 18 18  Temp: 97.8 F (36.6 C) 98 F (36.7 C)  SpO2: 100% 98%   Vitals:   07/23/18 0836 07/23/18 1327 07/23/18 2105 07/24/18 0515  BP: (!) 166/64 (!) 151/65 (!) 160/73 (!) 157/79  Pulse:  79 72 79  Resp:  16 18 18   Temp:  (!) 97.5 F (36.4 C) 97.8 F (36.6 C) 98 F (36.7 C)  TempSrc:  Oral Oral Oral  SpO2:  100% 100% 98%  Weight:    81.2 kg  Height:       General: Pt is alert, awake, not in acute distress Cardiovascular: RRR, S1/S2 +, no rubs, no gallops Respiratory: CTA bilaterally, no wheezing, no rhonchi Abdominal: Soft, NT, Distended slightly, bowel sounds + Extremities: no edema, no cyanosis; Right Knee in immobilizer  The results of significant diagnostics from this hospitalization (including imaging, microbiology, ancillary and  laboratory) are listed below for reference.    Microbiology: Recent Results (from the past 240 hour(s))  Urine culture     Status: Abnormal   Collection Time: 07/22/18  4:55 PM  Result Value Ref Range Status   Specimen Description   Final    URINE, RANDOM Performed at [facility] [address], [place], [place] [address]    Special Requests   Final    NONE Performed at [facility], [place] [address], [place], [place] [address]    Culture (A)  Final    <10,000  COLONIES/mL INSIGNIFICANT GROWTH Performed at [facility] [address] [place], [place] [address]    Report Status 07/24/2018 FINAL  Final    Labs: BNP (last 3 results) Recent Labs    04/16/18 0856  BNP 50.0   Basic Metabolic Panel: Recent Labs  Lab 07/22/18 1442 07/23/18 0541 07/24/18 0521  NA 140 141 141  K 3.6 3.8 3.8  CL 103 108 109  CO2 28 24 25   GLUCOSE 102* 138* 135*  BUN 18 13 11   CREATININE 0.79 0.93 0.74  CALCIUM 9.7 9.0 8.9  MG  --   --  1.8  PHOS  --   --  3.5   Liver Function Tests: Recent Labs  Lab 07/23/18 0541 07/24/18 0521  AST 17 14*  ALT 11 11  ALKPHOS 46 48  BILITOT 0.9 0.9  PROT 6.9 6.3*  ALBUMIN 3.5 3.4*   No results for input(s): LIPASE, AMYLASE in the last 168 hours. No results for input(s): AMMONIA in the last 168 hours. CBC: Recent Labs  Lab 07/22/18 1442 07/23/18 0541 07/24/18 0521  WBC 8.6 8.8 7.2  NEUTROABS 4.5  --  3.1  HGB 13.2 12.3 11.2*  HCT 42.9 40.0 37.2  MCV 96.8 98.8 98.7  PLT 279 263 233   Cardiac Enzymes: Recent Labs  Lab 07/23/18 0826 07/23/18 1349 07/23/18 2020  TROPONINI <0.03 <0.03 <0.03   BNP: Invalid input(s): POCBNP CBG: Recent Labs  Lab 07/23/18 0821 07/24/18 0752  GLUCAP 151* 116*   D-Dimer No results for input(s): DDIMER in the last 72 hours. Hgb A1c Recent Labs    07/23/18 0541  HGBA1C 6.0*   Lipid Profile No results for input(s): CHOL, HDL, LDLCALC, TRIG, CHOLHDL,  LDLDIRECT in the last 72 hours. Thyroid function studies Recent Labs    07/22/18 1712  TSH 3.233   Anemia work up No results for input(s): VITAMINB12, FOLATE, FERRITIN, TIBC, IRON, RETICCTPCT in the last 72 hours. Urinalysis    Component Value Date/Time   COLORURINE STRAW (A) 07/22/2018 1655   APPEARANCEUR CLEAR 07/22/2018 1655   LABSPEC 1.009 07/22/2018 1655   PHURINE 8.0 07/22/2018 1655   GLUCOSEU NEGATIVE 07/22/2018 1655   HGBUR NEGATIVE 07/22/2018 1655   BILIRUBINUR NEGATIVE 07/22/2018 1655   KETONESUR NEGATIVE 07/22/2018 1655   PROTEINUR NEGATIVE 07/22/2018 1655   UROBILINOGEN 0.2 07/19/2009 2224   NITRITE NEGATIVE 07/22/2018 1655   LEUKOCYTESUR NEGATIVE 07/22/2018 1655   Sepsis Labs Invalid input(s): PROCALCITONIN,  WBC,  LACTICIDVEN Microbiology Recent Results (from the past 240 hour(s))  Urine culture     Status: Abnormal   Collection Time: 07/22/18  4:55 PM  Result Value Ref Range Status   Specimen Description   Final    URINE, RANDOM Performed at [facility], [place] [address], [place], [place] [address]    Special Requests   Final    NONE Performed at [facility], [place] [address] [place], [place] [address]    Culture (A)  Final    <10,000 COLONIES/mL INSIGNIFICANT GROWTH Performed at [facility] [address] [place], [place] [address]    Report Status 07/24/2018 FINAL  Final   Time coordinating discharge: 35 minutes  SIGNED:  Kerney Elbe, DO [facility] 07/24/2018, 12:54 PM Pager is on [place]  If 7PM-7AM, please contact night-coverage [email or web address] Password [email or web address]

## 2018-07-24 NOTE — Progress Notes (Signed)
Occupational Therapy Treatment Patient Details Name: Audrey [name] MRN: [id number] DOB: 1933-01-22 Today's Date: 07/24/2018    History of present illness Audrey [name] is a 84 y.o. female with medical history significant of HTN, HLD, Asthma, IFG/Pre-Diabetes and other comorbidities who presents with a chief complaint of fall and immediate right knee pain. xray = right patella fx   OT comments  Pt making progress! Pt so pleasant and motivated!  Follow Up Recommendations  Home health OT;SNF;Supervision/Assistance - 24 hour(depdending on progress)    Equipment Recommendations  3 in 1 bedside commode    Recommendations for Other Services      Precautions / Restrictions Precautions Precautions: Fall Required Braces or Orthoses: Knee Immobilizer - Right Knee Immobilizer - Right: On at all times Restrictions Weight Bearing Restrictions: No Other Position/Activity Restrictions: per ED MD PN pt WBAT       Mobility Bed Mobility               General bed mobility comments: OOB in recliner   Transfers Overall transfer level: Needs assistance Equipment used: Rolling walker (2 wheeled) Transfers: Sit to/from [facility] Sit to Stand: Min assist Stand pivot transfers: Min assist       General transfer comment: cues for hand placement, assist RLE management, incr time needed     Balance Overall balance assessment: Needs assistance   Sitting balance-Leahy Scale: Fair       Standing balance-Leahy Scale: Poor Standing balance comment: reliant on UEs                           ADL either performed or assessed with clinical judgement   ADL Overall ADL's : Needs assistance/impaired     Grooming: Set up;Standing   Upper Body Bathing: Set up;Sitting   Lower Body Bathing: Moderate assistance;Sit to/from stand   Upper Body Dressing : Set up;Sitting   Lower Body Dressing: Moderate assistance   Toilet Transfer: Minimal  assistance;RW;BSC;Stand-pivot   Toileting- Clothing Manipulation and Hygiene: Sit to/from stand               Vision Patient Visual Report: No change from baseline     Perception     Praxis      Cognition Arousal/Alertness: Awake/alert Behavior During Therapy: WFL for tasks assessed/performed Overall Cognitive Status: Within Functional Limits for tasks assessed                                 General Comments: pleasant and smart.  Following all directions/instructions and asking good questions                    Pertinent Vitals/ Pain       Pain Assessment: No/denies pain     Prior Functioning/Environment              Frequency  Min 2X/week        Progress Toward Goals  OT Goals(current goals can now be found in the care plan section)  Progress towards OT goals: Progressing toward goals     Plan Discharge plan remains appropriate    Co-evaluation                 AM-PAC PT "6 Clicks" Daily Activity     Outcome Measure   Help from another person eating meals?: A Little Help from another person taking care of personal  grooming?: A Little Help from another person toileting, which includes using toliet, bedpan, or urinal?: A Lot Help from another person bathing (including washing, rinsing, drying)?: A Little Help from another person to put on and taking off regular upper body clothing?: A Little Help from another person to put on and taking off regular lower body clothing?: A Lot 6 Click Score: 16    End of Session Equipment Utilized During Treatment: Rolling walker;Right knee immobilizer  OT Visit Diagnosis: Unsteadiness on feet (R26.81);Muscle weakness (generalized) (M62.81);History of falling (Z91.81)   Activity Tolerance Patient tolerated treatment well   Patient Left in chair;with call bell/phone within reach   Nurse Communication Mobility status        Time: 1005-1030 OT Time Calculation (min): 25  min  Charges: OT General Charges $OT Visit: 1 Visit OT Treatments $Self Care/Home Management : 23-37 mins  Kari Baars, [place] [phone] Office- [employer], Edwena Felty D 07/24/2018, 3:41 PM

## 2018-07-25 DIAGNOSIS — R42 Dizziness and giddiness: Secondary | ICD-10-CM | POA: Diagnosis not present

## 2018-07-25 DIAGNOSIS — J45909 Unspecified asthma, uncomplicated: Secondary | ICD-10-CM | POA: Diagnosis not present

## 2018-07-25 DIAGNOSIS — S82041A Displaced comminuted fracture of right patella, initial encounter for closed fracture: Secondary | ICD-10-CM | POA: Diagnosis not present

## 2018-07-25 DIAGNOSIS — I10 Essential (primary) hypertension: Secondary | ICD-10-CM | POA: Diagnosis not present

## 2018-07-25 LAB — GLUCOSE, CAPILLARY: Glucose-Capillary: 104 mg/dL — ABNORMAL HIGH (ref 70–99)

## 2018-07-25 MED ORDER — POLYETHYLENE GLYCOL 3350 17 G PO PACK
17.0000 g | PACK | Freq: Two times a day (BID) | ORAL | Status: DC
  Administered 2018-07-25 – 2018-07-27 (×4): 17 g via ORAL
  Filled 2018-07-25 (×5): qty 1

## 2018-07-25 MED ORDER — SENNOSIDES-DOCUSATE SODIUM 8.6-50 MG PO TABS
1.0000 | ORAL_TABLET | Freq: Two times a day (BID) | ORAL | Status: DC
  Administered 2018-07-25 – 2018-07-27 (×4): 1 via ORAL
  Filled 2018-07-25 (×6): qty 1

## 2018-07-25 MED ORDER — BISACODYL 10 MG RE SUPP: 10.0000 mg | Freq: Every day | RECTAL | Status: DC | PRN

## 2018-07-25 NOTE — Progress Notes (Signed)
PROGRESS NOTE    Audrey [name]  WUJ:[id number] DOB: [birth year] DOA: 07/22/2018 PCP: Wenda Low, MD  Brief Narrative:  [id number] Audrey [name] a 84 y.o.femalewith medical history significant ofHTN, HLD, Asthma, IFG/Pre-Diabetesand other comorbidities who presents with a chief complaint of fall and immediate right knee pain. Patient was in her normal state of health this morning she was ambulating on her way to church between 930 and 10:00 she and up falling and was unable to get up. Upon her fall she had immediate pain was sharp at 9 out of 10 in severity nonradiating. Patient states that initially she felt dizziness and fell to the floor but did not actually pass out. No nausea or vomiting. Denies any chest pain, lightheadedness or dizziness. No other concerns or complaints at this time and [facility] was called to admit this patient for presyncope and right knee pain. Orthopedics Dr. Tamera Punt was consulted by the EDP and they recommended nonoperative management  ED Course:Patient had basic blood work done, given pain control, and had imaging of her hip, knee and femur.  **She improved Pain wise and PT recommending SNF. Social Work consulted for assistance with placement.She is deemed medically stable to D/C and awaiting SNF bed placement however could not go yesterday due to lack of insurance authorization.   Assessment & Plan:   Active Problems:   Patellar fracture   Asthma   HLD (hyperlipidemia)   Pre-diabetes   Essential hypertension   Pre-syncope   Dizziness  Pre-Syncope and Dizziness, improved -Place in observation telemetry -Cycled cardiac troponins to rule out ischemic causesand they were <0.03 x3 -ContinueTelemetry monitoring -CheckEchocardiogramand done showed Normal LV EF without wall motion abnormalities. Very mild aortic stenosis, with a component likely due to LVOT -Check orthostatic vital signs; She was orthostatic 07/23/18 as BP dropped but on  recheck yesterday she was not orthostatic -Gentle IV fluid hydration with D5W normal saline at a rate of 75 mL/hr now stopped  -PT/OT to evaluate and treatand recommending SNF -CheckedTSH and was 3.233 -Check urinalysis and urine culture; U/A was Negative -Check CT of the Head w/o Contrast andshowedNo evidence of acute intracranial abnormality;Atrophy and chronic small-vessel white matter ischemic changes. -Follow-up on PT recommendationsand they recommend SNF as patient lives alone.  Acute Patellar Fracture -PT/OT to evaluate and treatand recommneding SNF -Orthopedics was consulted by the EDP and recommends nonoperative management -Continue with pain control with acetaminophen,hydrocodone and as neededFentanyl -Pain Level was improved -C/w Bowel Regimen as patient is on Pain Meds (was just on senna docusate 1 tab p.o. nightly and has been adjusted as below) -Patient is agreeable to skilled nursing facility so I have notified the social worker to assist with placement -Medically stable to D/C, however lack of insurance authorization is kept her in the hospital  Hypertension -Blood pressure likely elevated in the setting of painand now was 155/91 -Continue homeAmlodipine 5 mg po Daily -Add PRN Hydralazine if necessary but patient has room to go up on her home amlodipine  HLD -Continue homeLovastatin substitution withPravastatin while hospitalized and Omega-3 fish oil -C/w Home Lovstatin at D/C   Asthma -Continue PRN albuterol nebs  Hyperglycemiain the setting of Pre-Diabetes/IFG -CheckHemoglobin A1cand was 6.0 -CBG's ranging from 104-161 -Continue monitor CBGs and place on sensitive NovoLog sliding scale insulin if necessary   Osteoporosis -Takes of Fosamax as an outpatient -May need vitamin D and calcium supplementationbut will defer to PCP in the outpatient setting   Obesity -Estimated body mass index is 31.36 kg/m as calculated from  the following:    Height as of this encounter: 5\' 3"  (1.6 m).   Weight as of this encounter: 80.3 kg. -Weight Loss Counseling given  Normocytic Anemia -Patient's hemoglobin/hematocrit dropped from 13.2/42.9 and then went to 12.3/40.09 was 11.2/37.2 yesterday  -Likely dilutional drop from IV fluid hydration and IV fluids have now stopped -Continue monitor for signs and symptoms he is not currently has no active signs of bleeding -Outpatient further work-up with anemia panel with PCP -Continue monitor and repeat CBC in the outpatient setting  Constipation -Adjusted bowel regimen and started the patient on MiraLAX 17 g p.o. twice daily, senna docusate 1 tab p.o. twice daily -Also added bisacodyl 10 mg rectal suppositories daily PRN for moderate constipation -Continue to monitor  DVT prophylaxis: Lovenox 40 mg sq q24h Code Status: FULL CODE Family Communication: No family present at bedside  Disposition Plan: SNF when bed available and insurance authorization obtained  Consultants:   None   Procedures:  ECHOCARDIOGRAM  ------------------------------------------------------------------- Study Conclusions  - Left ventricle: The cavity size was normal. Wall thickness was   normal. Systolic function was normal. The estimated ejection   fraction was in the range of 55% to 60%. Wall motion was normal;   there were no regional wall motion abnormalities. - Aortic valve: Transvalvular velocity was minimally increased.   There was very mild stenosis. Valve area (Vmean): 1.35 cm^2. - Mitral valve: There was no regurgitation. Valve area by pressure   half-time: 2.29 cm^2. - Right ventricle: Systolic function was mildly reduced. - Atrial septum: No defect or patent foramen ovale was identified. - Tricuspid valve: There was trivial regurgitation.  Impressions:  - Normal LV EF without wall motion abnormalities. Very mild aortic   stenosis, with a component likely due to LVOT.   Antimicrobials:    Anti-infectives (From admission, onward)   None     Subjective: Seen and examined at bedside and was doing well.  Denied any chest pain, lightheadedness or dizziness.  Her main complaint was that she had not had bowel movements Thursday and requesting some thing to help her have a bowel move.  No nausea or vomiting.  No other concerns or complaints at this time.  Resigned to the fact that she may remain in the hospital at least a Monday while her insurance authorization is approved.  Objective: Vitals:   07/24/18 1426 07/24/18 2028 07/24/18 2045 07/25/18 0535  BP: (!) 170/90 (!) 161/85  (!) 155/91  Pulse: 82 85  72  Resp:  18  18  Temp:  98.1 F (36.7 C)  98.6 F (37 C)  TempSrc:  Oral  Oral  SpO2: 93% 98% 100% 100%  Weight:    80.3 kg  Height:        Intake/Output Summary (Last 24 hours) at 07/25/2018 1128 Last data filed at 07/25/2018 0813 Gross per 24 hour  Intake 320 ml  Output 1900 ml  Net -1580 ml   Filed Weights   07/23/18 0500 07/24/18 0515 07/25/18 0535  Weight: 80.1 kg 81.2 kg 80.3 kg   Examination: Physical Exam:  Constitutional: Well-nourished, well-developed obese African-American female who is currently in no acute distress appears calm and comfortable sitting in chair bedside Respiratory: Clear to auscultation bilaterally no appreciable wheezing, rales, rhonchi patient was not tachypneic wheezing excess muscle breathe Cardiovascular: Regular rate and rhythm.  No appreciable murmurs, rubs or gallops Abdomen: Soft, nontender, slightly to body habitus.  Bowel sounds present GU: Deferred Musculoskeletal: Right knee is in knee immobilizer  Psychiatric: Pleasant mood and affect.  Intact judgment insight.  Patient awake and alert and oriented x3  Data Reviewed: I have personally reviewed following labs and imaging studies  CBC: Recent Labs  Lab 07/22/18 1442 07/23/18 0541 07/24/18 0521  WBC 8.6 8.8 7.2  NEUTROABS 4.5  --  3.1  HGB 13.2 12.3 11.2*  HCT  42.9 40.0 37.2  MCV 96.8 98.8 98.7  PLT 279 263 867   Basic Metabolic Panel: Recent Labs  Lab 07/22/18 1442 07/23/18 0541 07/24/18 0521  NA 140 141 141  K 3.6 3.8 3.8  CL 103 108 109  CO2 28 24 25   GLUCOSE 102* 138* 135*  BUN 18 13 11   CREATININE 0.79 0.93 0.74  CALCIUM 9.7 9.0 8.9  MG  --   --  1.8  PHOS  --   --  3.5   GFR: Estimated Creatinine Clearance: 53.5 mL/min (by C-G formula based on SCr of 0.74 mg/dL). Liver Function Tests: Recent Labs  Lab 07/23/18 0541 07/24/18 0521  AST 17 14*  ALT 11 11  ALKPHOS 46 48  BILITOT 0.9 0.9  PROT 6.9 6.3*  ALBUMIN 3.5 3.4*   No results for input(s): LIPASE, AMYLASE in the last 168 hours. No results for input(s): AMMONIA in the last 168 hours. Coagulation Profile: No results for input(s): INR, PROTIME in the last 168 hours. Cardiac Enzymes: Recent Labs  Lab 07/23/18 0826 07/23/18 1349 07/23/18 2020  TROPONINI <0.03 <0.03 <0.03   BNP (last 3 results) No results for input(s): PROBNP in the last 8760 hours. HbA1C: Recent Labs    07/23/18 0541  HGBA1C 6.0*   CBG: Recent Labs  Lab 07/23/18 0821 07/24/18 0752 07/25/18 0736  GLUCAP 151* 116* 104*   Lipid Profile: No results for input(s): CHOL, HDL, LDLCALC, TRIG, CHOLHDL, LDLDIRECT in the last 72 hours. Thyroid Function Tests: Recent Labs    07/22/18 1712  TSH 3.233   Anemia Panel: No results for input(s): VITAMINB12, FOLATE, FERRITIN, TIBC, IRON, RETICCTPCT in the last 72 hours. Sepsis Labs: No results for input(s): PROCALCITON, LATICACIDVEN in the last 168 hours.  Recent Results (from the past 240 hour(s))  Urine culture     Status: Abnormal   Collection Time: 07/22/18  4:55 PM  Result Value Ref Range Status   Specimen Description   Final    URINE, RANDOM Performed at [facility] [address], [place], [place] [address]    Special Requests   Final    NONE Performed at [facility], [place] [address],  [place], [place] [address]    Culture (A)  Final    <10,000 COLONIES/mL INSIGNIFICANT GROWTH Performed at [facility] [address] [place], [place] [address]    Report Status 07/24/2018 FINAL  Final     Radiology Studies: Ct Head Wo Contrast  Result Date: 07/23/2018 CLINICAL DATA:  84 year old female with acute fall yesterday with dizziness. EXAM: CT HEAD WITHOUT CONTRAST TECHNIQUE: Contiguous axial images were obtained from the base of the skull through the vertex without intravenous contrast. COMPARISON:  12/28/2007 FINDINGS: Brain: No evidence of acute infarction, hemorrhage, hydrocephalus, extra-axial collection or mass lesion/mass effect. Atrophy and chronic small-vessel white matter ischemic changes again noted. Vascular: Carotid atherosclerotic calcifications again noted. Skull: Normal. Negative for fracture or focal lesion. Sinuses/Orbits: No acute finding. Other: None. IMPRESSION: 1. No evidence of acute intracranial abnormality 2. Atrophy and chronic small-vessel white matter ischemic changes. Electronically Signed   By: Margarette Canada  M.D.   On: 07/23/2018 13:23   Scheduled Meds: . acetaminophen  650 mg Oral Once  . amLODipine  5 mg Oral Daily  . enoxaparin (LOVENOX) injection  40 mg Subcutaneous Q24H  . fentaNYL (SUBLIMAZE) injection  25 mcg Intravenous Once  . omega-3 acid ethyl esters  1 g Oral Daily  . polyethylene glycol  17 g Oral BID  . pravastatin  40 mg Oral q1800  . senna-docusate  1 tablet Oral BID   Continuous Infusions:   LOS: 0 days   Kerney Elbe, DO [facility] PAGER is on AMION  If 7PM-7AM, please contact night-coverage [email or web address] Password [email or web address] 07/25/2018, 11:28 AM

## 2018-07-26 DIAGNOSIS — J45909 Unspecified asthma, uncomplicated: Secondary | ICD-10-CM | POA: Diagnosis not present

## 2018-07-26 DIAGNOSIS — R42 Dizziness and giddiness: Secondary | ICD-10-CM | POA: Diagnosis not present

## 2018-07-26 DIAGNOSIS — I10 Essential (primary) hypertension: Secondary | ICD-10-CM | POA: Diagnosis not present

## 2018-07-26 DIAGNOSIS — S82041A Displaced comminuted fracture of right patella, initial encounter for closed fracture: Secondary | ICD-10-CM | POA: Diagnosis not present

## 2018-07-26 LAB — GLUCOSE, CAPILLARY: GLUCOSE-CAPILLARY: 111 mg/dL — AB (ref 70–99)

## 2018-07-26 MED ORDER — DICLOFENAC SODIUM 1 % TD GEL
2.0000 g | Freq: Four times a day (QID) | TRANSDERMAL | Status: DC
  Administered 2018-07-26 – 2018-07-27 (×5): 2 g via TOPICAL
  Filled 2018-07-26: qty 100

## 2018-07-26 NOTE — Progress Notes (Signed)
PROGRESS NOTE    Audrey [name]  TIR:[id number] DOB: 04/12/1933 DOA: 07/22/2018 PCP: Wenda Low, MD  Brief Narrative:  [id number] Audrey [name] a 84 y.o.femalewith medical history significant ofHTN, HLD, Asthma, IFG/Pre-Diabetesand other comorbidities who presents with a chief complaint of fall and immediate right knee pain. Patient was in her normal state of health this morning she was ambulating on her way to church between 930 and 10:00 she and up falling and was unable to get up. Upon her fall she had immediate pain was sharp at 9 out of 10 in severity nonradiating. Patient states that initially she felt dizziness and fell to the floor but did not actually pass out. No nausea or vomiting. Denies any chest pain, lightheadedness or dizziness. No other concerns or complaints at this time and [facility] was called to admit this patient for presyncope and right knee pain. Orthopedics Dr. Tamera Punt was consulted by the EDP and they recommended nonoperative management  ED Course:Patient had basic blood work done, given pain control, and had imaging of her hip, knee and femur.  **She improved Pain wise and PT recommending SNF. Social Work consulted for assistance with placement.She is deemed medically stable to D/C and awaiting SNF bed placement however cannot go due to lack of insurance authorization.   Assessment & Plan:   Active Problems:   Patellar fracture   Asthma   HLD (hyperlipidemia)   Pre-diabetes   Essential hypertension   Pre-syncope   Dizziness  Pre-Syncope and Dizziness, improved -Place in observation telemetry -Cycled cardiac troponins to rule out ischemic causesand they were <0.03 x3 -ContinueTelemetry monitoring -CheckEchocardiogramand done showed Normal LV EF without wall motion abnormalities. Very mild aortic stenosis, with a component likely due to LVOT -Check orthostatic vital signs; She was orthostatic 07/23/18 as BP dropped but on recheck yesterday  she was not orthostatic -Gentle IV fluid hydration with D5W normal saline at a rate of 75 mL/hr now stopped  -PT/OT to evaluate and treatand recommending SNF -CheckedTSH and was 3.233 -Check urinalysis and urine culture; U/A was Negative -Check CT of the Head w/o Contrast andshowedNo evidence of acute intracranial abnormality;Atrophy and chronic small-vessel white matter ischemic changes. -Follow-up on PT recommendationsand they recommend SNF as patient lives alone.  Acute Patellar Fracture -PT/OT to evaluate and treatand recommneding SNF -Orthopedics was consulted by the EDP and recommends nonoperative management -Continue with pain control with acetaminophen,hydrocodone and as neededFentanyl -Pain Level was improved -C/w Bowel Regimen as patient is on Pain Meds (was just on senna docusate 1 tab p.o. nightly and has been adjusted as below) -Patient is agreeable to skilled nursing facility so I have notified the social worker to assist with placement -Medically stable to D/C, however lack of insurance authorization has kept her in the hospital -*Was complaining of right leg lateral pain and itching and likely due to knee immobilizer.  Have ordered Voltaren gel to be placed on area  Hypertension -Blood pressure likely elevated in the setting of painand now was 147/82 -Continue homeAmlodipine 5 mg po Daily -Add PRN Hydralazine if necessary but patient has room to go up on her home amlodipine  HLD -Continue homeLovastatin substitution withPravastatin while hospitalized and Omega-3 fish oil -C/w Home Lovstatin at D/C   Asthma -Continue PRN albuterol nebs  Hyperglycemiain the setting of Pre-Diabetes/IFG -CheckHemoglobin A1cand was 6.0 -CBG's ranging from 104-116 -Continue monitor CBGs and place on sensitive NovoLog sliding scale insulin if necessary   Osteoporosis -Takes of Fosamax as an outpatient -May need vitamin D and  calcium supplementationbut will defer  to PCP in the outpatient setting   Obesity -Estimated body mass index is 30.66 kg/m as calculated from the following:   Height as of this encounter: 5\' 3"  (1.6 m).   Weight as of this encounter: 78.5 kg. -Weight Loss Counseling given  Normocytic Anemia -Patient's hemoglobin/hematocrit dropped from 13.2/42.9 and then went to 12.3/40.09 was 11.2/37.2 on 07/24/18 -Likely dilutional drop from IV fluid hydration and IV fluids have now stopped -Continue monitor for signs and symptoms he is not currently has no active signs of bleeding -Outpatient further work-up with anemia panel with PCP -Continue monitor and repeat CBC in the outpatient setting  Constipation, improved -Adjusted bowel regimen and started the patient on MiraLAX 17 g p.o. twice daily, senna docusate 1 tab p.o. twice daily -Also added bisacodyl 10 mg rectal suppositories daily PRN for moderate constipation -Continue to monitor  DVT prophylaxis: Lovenox 40 mg sq q24h Code Status: FULL CODE Family Communication: No family present at bedside  Disposition Plan: SNF when bed available and insurance authorization obtained  Consultants:   None   Procedures:  ECHOCARDIOGRAM  ------------------------------------------------------------------- Study Conclusions  - Left ventricle: The cavity size was normal. Wall thickness was   normal. Systolic function was normal. The estimated ejection   fraction was in the range of 55% to 60%. Wall motion was normal;   there were no regional wall motion abnormalities. - Aortic valve: Transvalvular velocity was minimally increased.   There was very mild stenosis. Valve area (Vmean): 1.35 cm^2. - Mitral valve: There was no regurgitation. Valve area by pressure   half-time: 2.29 cm^2. - Right ventricle: Systolic function was mildly reduced. - Atrial septum: No defect or patent foramen ovale was identified. - Tricuspid valve: There was trivial regurgitation.  Impressions:  -  Normal LV EF without wall motion abnormalities. Very mild aortic   stenosis, with a component likely due to LVOT.   Antimicrobials:  Anti-infectives (From admission, onward)   None     Subjective: Seen and examined at bedside was doing well.  Had no complaints except that she had some right lateral leg pain/dry skin and itchiness.  States that she had a bowel movement yesterday.  No nausea, vomiting, lightheadedness or dizziness.  No other concerns or complaints at this time  Objective: Vitals:   07/25/18 0535 07/25/18 1400 07/25/18 2113 07/26/18 0525  BP: (!) 155/91 124/64 (!) 153/76 (!) 147/82  Pulse: 72 80 75 72  Resp: 18 20 18 20   Temp: 98.6 F (37 C) 98.1 F (36.7 C) 98.1 F (36.7 C) 97.9 F (36.6 C)  TempSrc: Oral Oral Oral Oral  SpO2: 100% 98% 100% 98%  Weight: 80.3 kg   78.5 kg  Height:       No intake or output data in the 24 hours ending 07/26/18 1130 Filed Weights   07/24/18 0515 07/25/18 0535 07/26/18 0525  Weight: 81.2 kg 80.3 kg 78.5 kg   Examination: Physical Exam:  Constitutional: Well-nourished, well-developed slightly obese African-American female currently no acute distress Respiratory: Clear to auscultation bilaterally no appreciable wheezing, rales, rhonchi.  Patient is not tachypneic or using any accessory muscles to breathe Cardiovascular: Irregular rate and rhythm.  No appreciable murmurs, rubs or gallops Abdomen: Soft, nontender, slightly distended secondary body habitus.  Bowel sounds present GU: Deferred Musculoskeletal: Right knee is in a knee immobilizer however I removed it today to review where patient was talking about where she is having pain.  No visible lesion noted but  she did have some dry skin and was nonpainful on palpation Psychiatric: Pleasant mood and affect.  Intact judgment insight.  Patient is awake, alert and oriented x3  Data Reviewed: I have personally reviewed following labs and imaging studies  CBC: Recent Labs  Lab  07/22/18 1442 07/23/18 0541 07/24/18 0521  WBC 8.6 8.8 7.2  NEUTROABS 4.5  --  3.1  HGB 13.2 12.3 11.2*  HCT 42.9 40.0 37.2  MCV 96.8 98.8 98.7  PLT 279 263 675   Basic Metabolic Panel: Recent Labs  Lab 07/22/18 1442 07/23/18 0541 07/24/18 0521  NA 140 141 141  K 3.6 3.8 3.8  CL 103 108 109  CO2 28 24 25   GLUCOSE 102* 138* 135*  BUN 18 13 11   CREATININE 0.79 0.93 0.74  CALCIUM 9.7 9.0 8.9  MG  --   --  1.8  PHOS  --   --  3.5   GFR: Estimated Creatinine Clearance: 52.8 mL/min (by C-G formula based on SCr of 0.74 mg/dL). Liver Function Tests: Recent Labs  Lab 07/23/18 0541 07/24/18 0521  AST 17 14*  ALT 11 11  ALKPHOS 46 48  BILITOT 0.9 0.9  PROT 6.9 6.3*  ALBUMIN 3.5 3.4*   No results for input(s): LIPASE, AMYLASE in the last 168 hours. No results for input(s): AMMONIA in the last 168 hours. Coagulation Profile: No results for input(s): INR, PROTIME in the last 168 hours. Cardiac Enzymes: Recent Labs  Lab 07/23/18 0826 07/23/18 1349 07/23/18 2020  TROPONINI <0.03 <0.03 <0.03   BNP (last 3 results) No results for input(s): PROBNP in the last 8760 hours. HbA1C: No results for input(s): HGBA1C in the last 72 hours. CBG: Recent Labs  Lab 07/23/18 0821 07/24/18 0752 07/25/18 0736 07/26/18 0734  GLUCAP 151* 116* 104* 111*   Lipid Profile: No results for input(s): CHOL, HDL, LDLCALC, TRIG, CHOLHDL, LDLDIRECT in the last 72 hours. Thyroid Function Tests: No results for input(s): TSH, T4TOTAL, FREET4, T3FREE, THYROIDAB in the last 72 hours. Anemia Panel: No results for input(s): VITAMINB12, FOLATE, FERRITIN, TIBC, IRON, RETICCTPCT in the last 72 hours. Sepsis Labs: No results for input(s): PROCALCITON, LATICACIDVEN in the last 168 hours.  Recent Results (from the past 240 hour(s))  Urine culture     Status: Abnormal   Collection Time: 07/22/18  4:55 PM  Result Value Ref Range Status   Specimen Description   Final    URINE, RANDOM Performed at  [facility] [address] [place], [place] [address]    Special Requests   Final    NONE Performed at [facility], [place] [address], [place], [place] [address]    Culture (A)  Final    <10,000 COLONIES/mL INSIGNIFICANT GROWTH Performed at [facility] [address] [place], [place] [address]    Report Status 07/24/2018 FINAL  Final    Radiology Studies: No results found. Scheduled Meds: . acetaminophen  650 mg Oral Once  . amLODipine  5 mg Oral Daily  . diclofenac sodium  2 g Topical QID  . enoxaparin (LOVENOX) injection  40 mg Subcutaneous Q24H  . fentaNYL (SUBLIMAZE) injection  25 mcg Intravenous Once  . omega-3 acid ethyl esters  1 g Oral Daily  . polyethylene glycol  17 g Oral BID  . pravastatin  40 mg Oral q1800  . senna-docusate  1 tablet Oral BID   Continuous Infusions:   LOS: 0 days   Kerney Elbe, DO [facility]  PAGER is on AMION  If 7PM-7AM, please contact night-coverage [email or web address] Password [email or web address] 07/26/2018, 11:30 AM

## 2018-07-27 DIAGNOSIS — J45909 Unspecified asthma, uncomplicated: Secondary | ICD-10-CM | POA: Diagnosis not present

## 2018-07-27 DIAGNOSIS — I10 Essential (primary) hypertension: Secondary | ICD-10-CM | POA: Diagnosis not present

## 2018-07-27 DIAGNOSIS — R42 Dizziness and giddiness: Secondary | ICD-10-CM | POA: Diagnosis not present

## 2018-07-27 DIAGNOSIS — S82041A Displaced comminuted fracture of right patella, initial encounter for closed fracture: Secondary | ICD-10-CM | POA: Diagnosis not present

## 2018-07-27 LAB — GLUCOSE, CAPILLARY: Glucose-Capillary: 103 mg/dL — ABNORMAL HIGH (ref 70–99)

## 2018-07-27 MED ORDER — BISACODYL 10 MG RE SUPP: 10.0000 mg | Freq: Every day | RECTAL | 0 refills | Status: DC | PRN

## 2018-07-27 MED ORDER — POLYETHYLENE GLYCOL 3350 17 G PO PACK: 17.0000 g | PACK | Freq: Every day | ORAL | 0 refills | Status: DC | PRN

## 2018-07-27 MED ORDER — DICLOFENAC SODIUM 1 % TD GEL: 2.0000 g | Freq: Four times a day (QID) | TRANSDERMAL | 0 refills | Status: DC

## 2018-07-27 NOTE — Progress Notes (Addendum)
CSW followed up with [facility] to determine status of insurance authorization for discharge. [facility] Medicare authorization is still pending.  CSW following for discharge needs.  Update:  Insurance authorization has been approved. Patient may transport after 2pm.  Patient going to bed 123. Patient informed and agreeable. [facility]AR called.  RN Call for report: [place], [place] Worker [phone]

## 2018-07-27 NOTE — Progress Notes (Signed)
Report called to [employer]. Audrey [name]

## 2018-07-27 NOTE — Progress Notes (Signed)
Occupational Therapy Treatment Patient Details Name: Audrey [name] MRN: [id number] DOB: 1933-03-16 Today's Date: 07/27/2018    History of present illness Audrey [name] is a 84 y.o. female with medical history significant of HTN, HLD, Asthma, IFG/Pre-Diabetes and other comorbidities who presents with a chief complaint of fall and immediate right knee pain. xray = right patella fx   OT comments  Pt so pleasant and motivated  Follow Up Recommendations  Home health OT;SNF;Supervision/Assistance - 24 hour(depdending on progress)    Equipment Recommendations  3 in 1 bedside commode    Recommendations for Other Services      Precautions / Restrictions Precautions Precautions: Fall Required Braces or Orthoses: Knee Immobilizer - Right Knee Immobilizer - Right: On at all times Restrictions Other Position/Activity Restrictions: per ED MD PN pt WBAT       Mobility Bed Mobility               General bed mobility comments: OOB in recliner   Transfers Overall transfer level: Needs assistance Equipment used: Rolling walker (2 wheeled) Transfers: Sit to/from [facility] Sit to Stand: Min assist Stand pivot transfers: Min assist       General transfer comment: cues for hand placement, assist RLE management, incr time needed     Balance Overall balance assessment: Needs assistance   Sitting balance-Leahy Scale: Fair       Standing balance-Leahy Scale: Poor Standing balance comment: reliant on UEs                           ADL either performed or assessed with clinical judgement   ADL Overall ADL's : Needs assistance/impaired Eating/Feeding: Sitting;Independent   Grooming: Standing;Supervision/safety           Upper Body Dressing : Set up;Sitting   Lower Body Dressing: Minimal assistance;Sit to/from stand;Cueing for sequencing;Cueing for safety   Toilet Transfer: Supervision/safety;RW;Comfort height toilet   Toileting-  Clothing Manipulation and Hygiene: Supervision/safety;Sit to/from stand       Functional mobility during ADLs: Supervision/safety;Rolling walker General ADL Comments: pt plans to go to SNF to regain I with ADL activity as she lives alone.      Vision Patient Visual Report: No change from baseline            Cognition Arousal/Alertness: Awake/alert Behavior During Therapy: WFL for tasks assessed/performed Overall Cognitive Status: Within Functional Limits for tasks assessed                                                     Pertinent Vitals/ Pain       Pain Assessment: No/denies pain         Frequency  Min 2X/week        Progress Toward Goals  OT Goals(current goals can now be found in the care plan section)        Plan Discharge plan remains appropriate    Co-evaluation                 AM-PAC PT "6 Clicks" Daily Activity     Outcome Measure   Help from another person eating meals?: A Little Help from another person taking care of personal grooming?: A Little Help from another person toileting, which includes using toliet, bedpan, or urinal?: A Lot Help from  another person bathing (including washing, rinsing, drying)?: A Little Help from another person to put on and taking off regular upper body clothing?: A Little Help from another person to put on and taking off regular lower body clothing?: A Lot 6 Click Score: 16    End of Session Equipment Utilized During Treatment: Rolling walker;Right knee immobilizer  OT Visit Diagnosis: Unsteadiness on feet (R26.81);Muscle weakness (generalized) (M62.81);History of falling (Z91.81)   Activity Tolerance Patient tolerated treatment well   Patient Left in chair;with call bell/phone within reach   Nurse Communication Mobility status        Time: 5872-7618 OT Time Calculation (min): 32 min  Charges: OT General Charges $OT Visit: 1 Visit OT Treatments $Self Care/Home Management  : 23-37 mins  Kari Baars, Ashley [phone] Office- [phone]      Trooper, Edwena Felty D 07/27/2018, 1:21 PM

## 2018-07-27 NOTE — Discharge Summary (Signed)
Physician Discharge Summary  Audrey [name] OJJ:[id number] DOB: September 23, 1933 DOA: 07/22/2018  PCP: Wenda Low, MD  Admit date: 07/22/2018 Discharge date: 07/27/2018  Admitted From: Home Disposition: SNF  Recommendations for Outpatient Follow-up:  1. Follow up with PCP in 1-2 weeks 2. Follow up with Orthopedic Surgery Dr. Tamera Punt within 1-2 weeks for follow up for Patellar Fracture  3. Follow up with Cardiology as an outpatient for Event Monitor 4. Please obtain CMP/CBC, Mag, Phos in one week 5. Please follow up on the following pending results:  Home Health: No Equipment/Devices: None Recommended by PT    Discharge Condition: Stable CODE STATUS: FULL CODE Diet recommendation: Heart Healthy Carb Modified  Brief/Interim Summary: HBZ:JIRCVEL W Audrey [name] a 84 y.o.femalewith medical history significant ofHTN, HLD, Asthma, IFG/Pre-Diabetesand other comorbidities who presents with a chief complaint of fall and immediate right knee pain. Patient was in her normal state of health this morning she was ambulating on her way to church between 930 and 10:00 she and up falling and was unable to get up. Upon her fall she had immediate pain was sharp at 9 out of 10 in severity nonradiating. Patient states that initially she felt dizziness and fell to the floor but did not actually pass out. No nausea or vomiting. Denies any chest pain, lightheadedness or dizziness. No other concerns or complaints at this time and [facility] was called to admit this patient for presyncope and right knee pain. Orthopedics Dr. Tamera Punt was consulted by the EDP and they recommended nonoperative management  ED Course:Patient had basic blood work done, given pain control, and had imaging of her hip, knee and femur.  **She improved Pain wise and PT recommending SNF. Social Work consulted for assistance with placement.She is deemed medically stable to D/C and awaiting SNF bed placement however cannot go due to lack  of insurance authorization, however patient received Ship broker and is stable to D/C today.  She will need to follow with PCP, cardiology and orthopedic surgeon outpatient setting.  Discharge Diagnoses:  Active Problems:   Patellar fracture   Asthma   HLD (hyperlipidemia)   Pre-diabetes   Essential hypertension   Pre-syncope   Dizziness  Pre-Syncope and Dizziness, improved -Place in observation telemetry -Cycledcardiac troponins to rule out ischemic causesandthey were <0.03 x3 -ContinueTelemetry monitoring -CheckEchocardiogramand doneshowedNormal LV EF without wall motion abnormalities. Very mild aortic stenosis, with a component likely due to LVOT -Check orthostatic vital signs; She was orthostatic 07/23/18 as BP dropped but on recheck 07/24/18 she was not orthostatic -Gentle IV fluid hydration withD5Wnormal saline at a rate of48mL/hr now stopped -PT/OT to evaluate and treatand recommending SNF -CheckedTSH and was 3.233 -Check urinalysis and urine culture; U/A was Negative -Check CT of the Head w/o Contrast andshowedNo evidence of acute intracranial abnormality;Atrophy and chronic small-vessel white matter ischemic changes. -Follow up with Cardiology as an outpatient for Event Monitor  -Follow-up on PT recommendationsand they recommend SNF as patient lives alone.  Acute Patellar Fracture -PT/OT to evaluate and treatand recommneding SNF -Orthopedics was consulted by the EDP and recommends nonoperative management -Continue with pain control with acetaminophen,hydrocodone and as neededFentanyl -Pain Level was improved -C/w Bowel Regimen as patient is on Pain Meds (was just on senna docusate 1 tab p.o. nightly and has been adjusted as below) -Patient is agreeable to skilled nursing facility so I have notified the social worker to assist with placement -Medically stable to D/C, however lack of insurance authorization has kept her in the  hospital -*Was complaining of right  leg lateral pain and itching and likely due to knee immobilizer.  Have ordered Voltaren gel to be placed on area and this has helped. Will D/C with Voltaren gel at D/C  Hypertension -Blood pressure likely elevated in the setting of painand now was 141/75 -Continue homeAmlodipine 5 mg po Daily -Add PRN Hydralazine if necessary but patient has room to go up on her home amlodipine  HLD -Continue homeLovastatin substitution withPravastatinwhile hospitalizedand Omega-3 fish oil -C/w Home Lovstatin at D/C  Asthma -Continue PRN albuterol nebs  Hyperglycemiain the setting of Pre-Diabetes/IFG -CheckHemoglobin A1cand was 6.0 -CBG's ranging from 103-111 -Continue monitor CBGs and place on sensitive NovoLog sliding scale insulinif necessary  Osteoporosis -Takes of Fosamax as an outpatient -May need vitamin D and calcium supplementationbut will defer to PCP in the outpatient setting   Obesity -Estimated body mass index is 31.73 kg/m as calculated from the following:   Height as of this encounter: 5\' 3"  (1.6 m).   Weight as of this encounter: 81.2 kg. -Weight Loss Counseling given  NormocyticAnemia -Patient's hemoglobin/hematocrit dropped from 13.2/42.9 and then went to 12.3/40.09 was 11.2/37.2 on 07/24/18 -Likely dilutional drop from IV fluid hydration and IV fluids have now stopped -Continue monitor for signs and symptoms he is not currently has no active signs of bleeding -Outpatient further work-up with anemia panel with PCP -Continue monitor and repeat CBC in the outpatient setting  Constipation, improved -Adjusted bowel regimen and started the patient on MiraLAX 17 g p.o. twice daily, senna docusate 1 tab p.o. twice daily -Also added bisacodyl 10 mg rectal suppositories daily PRN for moderate constipation -Continue to monitor  Discharge Instructions Discharge Instructions    Call MD for:  difficulty breathing, headache or  visual disturbances   Complete by:  As directed    Call MD for:  extreme fatigue   Complete by:  As directed    Call MD for:  hives   Complete by:  As directed    Call MD for:  persistant dizziness or light-headedness   Complete by:  As directed    Call MD for:  persistant nausea and vomiting   Complete by:  As directed    Call MD for:  redness, tenderness, or signs of infection (pain, swelling, redness, odor or green/yellow discharge around incision site)   Complete by:  As directed    Call MD for:  severe uncontrolled pain   Complete by:  As directed    Call MD for:  temperature >100.4   Complete by:  As directed    Diet - low sodium heart healthy   Complete by:  As directed    Diet Carb Modified   Complete by:  As directed    Discharge instructions   Complete by:  As directed    You were cared for by a hospitalist during your hospital stay. If you have any questions about your discharge medications or the care you received while you were in the hospital after you are discharged, you can call the unit and ask to speak with the hospitalist on call if the hospitalist that took care of you is not available. Once you are discharged, your primary care physician will handle any further medical issues. Please note that NO REFILLS for any discharge medications will be authorized once you are discharged, as it is imperative that you return to your primary care physician (or establish a relationship with a primary care physician if you do not have one) for your aftercare needs so  that they can reassess your need for medications and monitor your lab values.  Follow up with PCP and Orthopedic Surgery in the outpatient setting. Take all medications as prescribed. If symptoms change or worsen please return to the ED for evaluation   Increase activity slowly   Complete by:  As directed      Allergies as of 07/27/2018      Reactions   Penicillins Other (See Comments)   Hallucinations Has patient  had a PCN reaction causing immediate rash, facial/tongue/throat swelling, SOB or lightheadedness with hypotension:No Has patient had a PCN reaction causing severe rash involving mucus membranes or skin necrosis:No Has patient had a PCN reaction that required hospitalization:No Has patient had a PCN reaction occurring within the last 10 years:NO If all of the above answers are "NO", then may proceed with Cephalosporin use.      Medication List    STOP taking these medications   predniSONE 20 MG tablet Commonly known as:  DELTASONE     TAKE these medications   acetaminophen 325 MG tablet Commonly known as:  TYLENOL Take 2 tablets (650 mg total) by mouth every 6 (six) hours as needed for mild pain (or Fever >/= 101).   albuterol 108 (90 Base) MCG/ACT inhaler Commonly known as:  PROVENTIL HFA;VENTOLIN HFA Inhale 2 puffs into the lungs every 6 (six) hours as needed for shortness of breath.   albuterol (2.5 MG/3ML) 0.083% nebulizer solution Commonly known as:  PROVENTIL Inhale 2.5 mg into the lungs 3 (three) times daily as needed for shortness of breath.   alendronate 70 MG tablet Commonly known as:  FOSAMAX Take 70 mg by mouth every Wednesday.   amLODipine 5 MG tablet Commonly known as:  NORVASC Take 5 mg by mouth daily.   bisacodyl 10 MG suppository Commonly known as:  DULCOLAX Place 1 suppository (10 mg total) rectally daily as needed for moderate constipation.   diclofenac sodium 1 % Gel Commonly known as:  VOLTAREN Apply 2 g topically 4 (four) times daily.   HYDROcodone-acetaminophen 5-325 MG tablet Commonly known as:  NORCO/VICODIN Take 1-2 tablets by mouth every 4 (four) hours as needed for moderate pain.   lovastatin 40 MG tablet Commonly known as:  MEVACOR Take 40 mg by mouth daily.   omega-3 acid ethyl esters 1 g capsule Commonly known as:  LOVAZA Take 1 g by mouth daily. Reported on 04/14/2016   OVER THE COUNTER MEDICATION Take 1 tablet by mouth daily.  brain support vitamin   polyethylene glycol packet Commonly known as:  MIRALAX / GLYCOLAX Take 17 g by mouth daily as needed for moderate constipation.   senna-docusate 8.6-50 MG tablet Commonly known as:  Senokot-S Take 1 tablet by mouth at bedtime as needed for mild constipation.   triamcinolone cream 0.5 % Commonly known as:  KENALOG Apply 1 application topically 2 (two) times daily as needed (itchy skin).      Contact information for after-discharge care    Destination    [facility] Preferred SNF .   Service:  Skilled Nursing Contact information: [address] [address] [phone]             Allergies  Allergen Reactions  . Penicillins Other (See Comments)    Hallucinations Has patient had a PCN reaction causing immediate rash, facial/tongue/throat swelling, SOB or lightheadedness with hypotension:No Has patient had a PCN reaction causing severe rash involving mucus membranes or skin necrosis:No Has patient had a PCN reaction  that required hospitalization:No Has patient had a PCN reaction occurring within the last 10 years:NO If all of the above answers are "NO", then may proceed with Cephalosporin use.    Consultations:  Case was discussed with Orthopedic Surgery Dr. Tamera Punt by EDP  Procedures/Studies: Ct Head Wo Contrast  Result Date: 07/23/2018 CLINICAL DATA:  84 year old female with acute fall yesterday with dizziness. EXAM: CT HEAD WITHOUT CONTRAST TECHNIQUE: Contiguous axial images were obtained from the base of the skull through the vertex without intravenous contrast. COMPARISON:  12/28/2007 FINDINGS: Brain: No evidence of acute infarction, hemorrhage, hydrocephalus, extra-axial collection or mass lesion/mass effect. Atrophy and chronic small-vessel white matter ischemic changes again noted. Vascular: Carotid atherosclerotic calcifications again noted. Skull: Normal. Negative for fracture or focal lesion.  Sinuses/Orbits: No acute finding. Other: None. IMPRESSION: 1. No evidence of acute intracranial abnormality 2. Atrophy and chronic small-vessel white matter ischemic changes. Electronically Signed   By: Margarette Canada M.D.   On: 07/23/2018 13:23   Dg Knee Complete 4 Views Right  Result Date: 07/22/2018 CLINICAL DATA:  Right knee pain after fall. EXAM: RIGHT KNEE - COMPLETE 4+ VIEW COMPARISON:  None. FINDINGS: Moderately displaced and comminuted fracture is seen involving the patella. The visualized femur, tibia and fibula appear intact. Joint spaces are unremarkable. Mild suprapatellar joint effusion is noted. IMPRESSION: Comminuted and displaced patellar fracture. Electronically Signed   By: Marijo Conception, M.D.   On: 07/22/2018 13:21   Dg Hip Unilat W Or Wo Pelvis 2-3 Views Right  Result Date: 07/22/2018 CLINICAL DATA:  Fall. EXAM: DG HIP (WITH OR WITHOUT PELVIS) 2-3V RIGHT COMPARISON:  None. FINDINGS: There is no evidence of hip fracture or dislocation. There is no evidence of arthropathy or other focal bone abnormality. IMPRESSION: Negative. Electronically Signed   By: Marijo Conception, M.D.   On: 07/22/2018 13:22   Dg Femur Min 2 Views Right  Result Date: 07/22/2018 CLINICAL DATA:  Fall. EXAM: RIGHT FEMUR 2 VIEWS COMPARISON:  None. FINDINGS: Patellar fracture is again noted.  The femur appears normal. IMPRESSION: Moderately displaced patellar fracture is noted. Femur appears normal. Electronically Signed   By: Marijo Conception, M.D.   On: 07/22/2018 13:23   ECHOCARDIOGRAM 07/23/18   ------------------------------------------------------------------- Study Conclusions  - Left ventricle: The cavity size was normal. Wall thickness was   normal. Systolic function was normal. The estimated ejection   fraction was in the range of 55% to 60%. Wall motion was normal;   there were no regional wall motion abnormalities. - Aortic valve: Transvalvular velocity was minimally increased.   There was  very mild stenosis. Valve area (Vmean): 1.35 cm^2. - Mitral valve: There was no regurgitation. Valve area by pressure   half-time: 2.29 cm^2. - Right ventricle: Systolic function was mildly reduced. - Atrial septum: No defect or patent foramen ovale was identified. - Tricuspid valve: There was trivial regurgitation.  Impressions:  - Normal LV EF without wall motion abnormalities. Very mild aortic   stenosis, with a component likely due to LVOT.  Subjective: Seen and examined at bedside and any complaints and states that she is doing well.  No chest pain, lightheadedness or dizziness.  No other concerns or complaints at this time and is agreeable to going to SNF short-term.  Discharge Exam: Vitals:   07/26/18 1954 07/27/18 0421  BP: (!) 148/86 (!) 141/75  Pulse: 80 69  Resp: 20 18  Temp: 98.2 F (36.8 C) 97.6 F (36.4 C)  SpO2: 100% 100%  Vitals:   07/26/18 1400 07/26/18 1954 07/27/18 0421 07/27/18 0423  BP:  (!) 148/86 (!) 141/75   Pulse: 79 80 69   Resp:  20 18   Temp:  98.2 F (36.8 C) 97.6 F (36.4 C)   TempSrc:  Oral Oral   SpO2: 91% 100% 100%   Weight:    81.2 kg  Height:       General: Pt is alert, awake, not in acute distress Cardiovascular: RRR, S1/S2 +, no rubs, no gallops Respiratory: CTA bilaterally, no wheezing, no rhonchi Abdominal: Soft, NT, Distended slightly, bowel sounds + Extremities: no edema, no cyanosis; Right Knee in immobilizer   The results of significant diagnostics from this hospitalization (including imaging, microbiology, ancillary and laboratory) are listed below for reference.    Microbiology: Recent Results (from the past 240 hour(s))  Urine culture     Status: Abnormal   Collection Time: 07/22/18  4:55 PM  Result Value Ref Range Status   Specimen Description   Final    URINE, RANDOM Performed at [facility] [address] [place], [place] [address]    Special Requests   Final    NONE Performed at [facility], [place] [address], [place], [place] [address]    Culture (A)  Final    <10,000 COLONIES/mL INSIGNIFICANT GROWTH Performed at [facility] [address], [place], [place] [address]    Report Status 07/24/2018 FINAL  Final    Labs: BNP (last 3 results) Recent Labs    04/16/18 0856  BNP 54.0   Basic Metabolic Panel: Recent Labs  Lab 07/22/18 1442 07/23/18 0541 07/24/18 0521  NA 140 141 141  K 3.6 3.8 3.8  CL 103 108 109  CO2 28 24 25   GLUCOSE 102* 138* 135*  BUN 18 13 11   CREATININE 0.79 0.93 0.74  CALCIUM 9.7 9.0 8.9  MG  --   --  1.8  PHOS  --   --  3.5   Liver Function Tests: Recent Labs  Lab 07/23/18 0541 07/24/18 0521  AST 17 14*  ALT 11 11  ALKPHOS 46 48  BILITOT 0.9 0.9  PROT 6.9 6.3*  ALBUMIN 3.5 3.4*   No results for input(s): LIPASE, AMYLASE in the last 168 hours. No results for input(s): AMMONIA in the last 168 hours. CBC: Recent Labs  Lab 07/22/18 1442 07/23/18 0541 07/24/18 0521  WBC 8.6 8.8 7.2  NEUTROABS 4.5  --  3.1  HGB 13.2 12.3 11.2*  HCT 42.9 40.0 37.2  MCV 96.8 98.8 98.7  PLT 279 263 233   Cardiac Enzymes: Recent Labs  Lab 07/23/18 0826 07/23/18 1349 07/23/18 2020  TROPONINI <0.03 <0.03 <0.03   BNP: Invalid input(s): POCBNP CBG: Recent Labs  Lab 07/23/18 0821 07/24/18 0752 07/25/18 0736 07/26/18 0734 07/27/18 0840  GLUCAP 151* 116* 104* 111* 103*   D-Dimer No results for input(s): DDIMER in the last 72 hours. Hgb A1c No results for input(s): HGBA1C in the last 72 hours. Lipid Profile No results for input(s): CHOL, HDL, LDLCALC, TRIG, CHOLHDL, LDLDIRECT in the last 72 hours. Thyroid function studies No results for input(s): TSH, T4TOTAL, T3FREE, THYROIDAB in the last 72 hours.  Invalid input(s): FREET3 Anemia work up No results for input(s): VITAMINB12, FOLATE, FERRITIN, TIBC, IRON, RETICCTPCT in the last 72 hours. Urinalysis    Component Value Date/Time   COLORURINE STRAW (A)  07/22/2018 1655   APPEARANCEUR CLEAR 07/22/2018 1655   LABSPEC 1.009 07/22/2018  [place] 8.0 07/22/2018 1655   GLUCOSEU NEGATIVE 07/22/2018 1655   HGBUR NEGATIVE 07/22/2018 [place] 07/22/2018 1655   KETONESUR NEGATIVE 07/22/2018 1655   PROTEINUR NEGATIVE 07/22/2018 1655   UROBILINOGEN 0.2 07/19/2009 2224   NITRITE NEGATIVE 07/22/2018 1655   LEUKOCYTESUR NEGATIVE 07/22/2018 1655   Sepsis Labs Invalid input(s): PROCALCITONIN,  WBC,  LACTICIDVEN Microbiology Recent Results (from the past 240 hour(s))  Urine culture     Status: Abnormal   Collection Time: 07/22/18  4:55 PM  Result Value Ref Range Status   Specimen Description   Final    URINE, RANDOM Performed at [facility], [place] [address] [place], [place] [address]    Special Requests   Final    NONE Performed at [facility], [place] [address] [place], [place] [address]    Culture (A)  Final    <10,000 COLONIES/mL INSIGNIFICANT GROWTH Performed at [facility] [address] [place], [place] [address]    Report Status 07/24/2018 FINAL  Final   Time coordinating discharge: 25 minutes  SIGNED:  Kerney Elbe, DO [facility] 07/27/2018, 12:47 PM Pager is on [place]  If 7PM-7AM, please contact night-coverage [email or web address] Password [email or web address]

## 2018-07-27 NOTE — Clinical Social Work Placement (Signed)
   CLINICAL SOCIAL WORK PLACEMENT  NOTE  Date:  07/27/2018  Patient Details  Name: Audrey [name] MRN: [id number] Date of Birth: Sep 16, 1933  Clinical Social Work is seeking post-discharge placement for this patient at the [place] level of care (*CSW will initial, date and re-position this form in  chart as items are completed):  Yes   Patient/family provided with [facility] Work Department's list of facilities offering this level of care within the geographic area requested by the patient (or if unable, by the patient's family).  Yes   Patient/family informed of their freedom to choose among providers that offer the needed level of care, that participate in Medicare, Medicaid or managed care program needed by the patient, have an available bed and are willing to accept the patient.  Yes   Patient/family informed of [facility]'s ownership interest in [facility], as well as of the fact that they are under no obligation to receive care at these facilities.  PASRR submitted to EDS on   07/24/18  PASRR number received on     07/24/18  Existing PASRR number confirmed on     07/24/18  FL2 transmitted to all facilities in geographic area requested by pt/family on   07/24/18     FL2 transmitted to all facilities within larger geographic area on   07/24/18      Yes   Patient/family informed of bed offers received.  Patient chooses bed at [facility]     Physician recommends and patient chooses bed at      Patient to be transferred to [facility] on 07/27/18.  Patient to be transferred to facility by [facility]     Patient family notified on 07/27/18 of transfer.   PHYSICIAN       Additional Comment:    _______________________________________________ Joellen Jersey, LCSWA 07/27/2018, 1:13 PM

## 2019-06-22 ENCOUNTER — Other Ambulatory Visit: Payer: Self-pay

## 2019-06-22 ENCOUNTER — Ambulatory Visit (INDEPENDENT_AMBULATORY_CARE_PROVIDER_SITE_OTHER): Payer: Medicare Other

## 2019-06-22 ENCOUNTER — Encounter: Payer: Self-pay | Admitting: Podiatry

## 2019-06-22 ENCOUNTER — Ambulatory Visit (INDEPENDENT_AMBULATORY_CARE_PROVIDER_SITE_OTHER): Payer: Medicare Other | Admitting: Podiatry

## 2019-06-22 VITALS — BP 156/84 | HR 68 | Resp 18

## 2019-06-22 DIAGNOSIS — M779 Enthesopathy, unspecified: Secondary | ICD-10-CM | POA: Diagnosis not present

## 2019-06-22 DIAGNOSIS — M19072 Primary osteoarthritis, left ankle and foot: Secondary | ICD-10-CM

## 2019-06-22 DIAGNOSIS — M778 Other enthesopathies, not elsewhere classified: Secondary | ICD-10-CM

## 2019-06-22 MED ORDER — DICLOFENAC SODIUM 1 % TD GEL: 4.0000 g | Freq: Four times a day (QID) | TRANSDERMAL | 2 refills | Status: DC

## 2019-06-22 NOTE — Progress Notes (Signed)
Subjective:  Patient ID: Audrey [name], female    DOB: 1933/07/21,  MRN: [id number] HPI Chief Complaint  Patient presents with  . Foot Pain    Forefoot left - aching x 1 year intermittent, "feels puffy on the inside", no injury, tried soaking  . New Patient (Initial Visit)    85 y.o. female presents with the above complaint.   ROS: Denies fever chills nausea vomiting muscle aches pains calf pain back pain chest pain shortness of breath.  Past Medical History:  Diagnosis Date  . Asthma   . Hypertension    Past Surgical History:  Procedure Laterality Date  . ABDOMINAL HYSTERECTOMY    . BREAST BIOPSY Right 1999   Stereo Core  . BREAST EXCISIONAL BIOPSY Right 1995    Current Outpatient Medications:  .  acetaminophen (TYLENOL) 325 MG tablet, Take 2 tablets (650 mg total) by mouth every 6 (six) hours as needed for mild pain (or Fever >/= 101)., Disp: , Rfl:  .  albuterol (PROVENTIL HFA;VENTOLIN HFA) 108 (90 BASE) MCG/ACT inhaler, Inhale 2 puffs into the lungs every 6 (six) hours as needed for shortness of breath. , Disp: , Rfl:  .  albuterol (PROVENTIL) (2.5 MG/3ML) 0.083% nebulizer solution, Inhale 2.5 mg into the lungs 3 (three) times daily as needed for shortness of breath., Disp: , Rfl: 5 .  alendronate (FOSAMAX) 70 MG tablet, Take 70 mg by mouth every Wednesday., Disp: , Rfl: 3 .  amLODipine (NORVASC) 5 MG tablet, Take 5 mg by mouth daily., Disp: , Rfl:  .  bisacodyl (DULCOLAX) 10 MG suppository, Place 1 suppository (10 mg total) rectally daily as needed for moderate constipation., Disp: 12 suppository, Rfl: 0 .  diclofenac sodium (VOLTAREN) 1 % GEL, Apply 4 g topically 4 (four) times daily., Disp: 100 g, Rfl: 2 .  lovastatin (MEVACOR) 40 MG tablet, Take 40 mg by mouth daily., Disp: , Rfl:  .  omega-3 acid ethyl esters (LOVAZA) 1 G capsule, Take 1 g by mouth daily. Reported on 04/14/2016, Disp: , Rfl:  .  OVER THE COUNTER MEDICATION, Take 1 tablet by mouth daily. brain support  vitamin, Disp: , Rfl:  .  polyethylene glycol (MIRALAX / GLYCOLAX) packet, Take 17 g by mouth daily as needed for moderate constipation., Disp: 14 each, Rfl: 0 .  senna-docusate (SENOKOT-S) 8.6-50 MG tablet, Take 1 tablet by mouth at bedtime as needed for mild constipation., Disp: , Rfl:  .  triamcinolone cream (KENALOG) 0.5 %, Apply 1 application topically 2 (two) times daily as needed (itchy skin). , Disp: , Rfl:   Allergies  Allergen Reactions  . Penicillins Other (See Comments)    Hallucinations Has patient had a PCN reaction causing immediate rash, facial/tongue/throat swelling, SOB or lightheadedness with hypotension:No Has patient had a PCN reaction causing severe rash involving mucus membranes or skin necrosis:No Has patient had a PCN reaction that required hospitalization:No Has patient had a PCN reaction occurring within the last 10 years:NO If all of the above answers are "NO", then may proceed with Cephalosporin use.    Review of Systems Objective:   Vitals:   06/22/19 0933  BP: (!) 156/84  Pulse: 68  Resp: 18    General: Well developed, nourished, in no acute distress, alert and oriented x3   Dermatological: Skin is warm, dry and supple bilateral. Nails x 10 are well maintained; remaining integument appears unremarkable at this time. There are no open sores, no preulcerative lesions, no rash or signs of infection  present.  Vascular: Dorsalis Pedis artery and Posterior Tibial artery pedal pulses are 2/4 bilateral with immedate capillary fill time. Pedal hair growth present. No varicosities and no lower extremity edema present bilateral.   Neruologic: Grossly intact via light touch bilateral. Vibratory intact via tuning fork bilateral. Protective threshold with Semmes Wienstein monofilament intact to all pedal sites bilateral. Patellar and Achilles deep tendon reflexes 2+ bilateral. No Babinski or clonus noted bilateral.   Musculoskeletal: No gross boney pedal deformities  bilateral. No pain, crepitus, or limitation noted with foot and ankle range of motion bilateral. Muscular strength 5/5 in all groups tested bilateral.  Gait: Unassisted, Nonantalgic.    Radiographs:  Radiographs taken today demonstrate osteoarthritic change in the midfoot.  No acute findings.  Assessment & Plan:   Assessment: Osteoarthritis capsulitis left foot.  Plan: Start her on diclofenac gel and discussed appropriate shoe gear.  We also got her set up with Dr. Elisha Ponder for routine nail debridement.     Jamontae Thwaites T. [place], [place]

## 2019-09-13 ENCOUNTER — Ambulatory Visit: Payer: Medicare Other | Admitting: Podiatry

## 2019-09-13 ENCOUNTER — Other Ambulatory Visit: Payer: Self-pay

## 2019-09-13 ENCOUNTER — Encounter: Payer: Self-pay | Admitting: Podiatry

## 2019-09-13 DIAGNOSIS — B351 Tinea unguium: Secondary | ICD-10-CM | POA: Diagnosis not present

## 2019-09-13 DIAGNOSIS — M79675 Pain in left toe(s): Secondary | ICD-10-CM | POA: Diagnosis not present

## 2019-09-13 DIAGNOSIS — M79674 Pain in right toe(s): Secondary | ICD-10-CM | POA: Diagnosis not present

## 2019-09-13 NOTE — Progress Notes (Signed)
Subjective: Audrey [name] is seen today cc of longstanding painful, elongated, thickened toenails bilateral feet that she cannot cut. Pain interferes with daily activities. Aggravating factor includes wearing enclosed shoe gear and relieved with periodic debridement.  Medications reviewed in chart.  Allergies  Allergen Reactions  . Penicillins Other (See Comments)    Hallucinations Has patient had a PCN reaction causing immediate rash, facial/tongue/throat swelling, SOB or lightheadedness with hypotension:No Has patient had a PCN reaction causing severe rash involving mucus membranes or skin necrosis:No Has patient had a PCN reaction that required hospitalization:No Has patient had a PCN reaction occurring within the last 10 years:NO If all of the above answers are "NO", then may proceed with Cephalosporin use.     Objective:  Vascular Examination: Capillary refill time immediate b/l.  Dorsalis pedis present b/l.  Posterior tibial pulses present b/l.  Digital hair present x 10 digits.  Skin temperature gradient WNL b/l.   Dermatological Examination: Skin with normal turgor, texture and tone b/l.  Toenails 1-5 b/l discolored, thick, dystrophic with subungual debris and pain with palpation to nailbeds due to thickness of nails.  Musculoskeletal: Muscle strength 5/5 to all LE muscle groups b/l.  No gross bony deformities b/l.  No pain, crepitus or joint limitation noted with ROM.   Neurological Examination: Protective sensation intact with 10 gram monofilament bilaterally.  Epicritic sensation present bilaterally.  Vibratory sensation intact bilaterally.   Assessment: Painful onychomycosis toenails 1-5 b/l   Plan: 1. Toenails 1-5 b/l were debrided in length and girth without iatrogenic bleeding. 2. Patient to continue soft, supportive shoe gear daily. 3. Patient to report any pedal injuries to medical professional immediately. 4. Follow up 9  weeks. 5. Patient/POA to call should there be a concern in the interim.

## 2019-10-23 ENCOUNTER — Ambulatory Visit: Payer: Medicare Other | Attending: Internal Medicine

## 2019-10-23 DIAGNOSIS — Z23 Encounter for immunization: Secondary | ICD-10-CM

## 2019-10-23 NOTE — Progress Notes (Signed)
   Covid-19 Vaccination Clinic  Name:  Audrey [name]    MRN: [id number] DOB: 1933-06-20  10/23/2019  Audrey [name] was observed post Covid-19 immunization for 15 minutes without incidence. She was provided with Vaccine Information Sheet and instruction to access the V-Safe system.   Audrey [name] was instructed to call 911 with any severe reactions post vaccine: Marland Kitchen Difficulty breathing  . Swelling of your face and throat  . A fast heartbeat  . A bad rash all over your body  . Dizziness and weakness    Immunizations Administered    Name Date Dose VIS Date Route   Pfizer COVID-19 Vaccine 10/23/2019 11:40 AM 0.3 mL 09/10/2019 Intramuscular   Manufacturer: [place]   Lot: [id number]   [place]: [id number]

## 2019-11-13 ENCOUNTER — Ambulatory Visit: Payer: Medicare Other | Attending: Internal Medicine

## 2019-11-13 DIAGNOSIS — Z23 Encounter for immunization: Secondary | ICD-10-CM | POA: Insufficient documentation

## 2019-11-13 NOTE — Progress Notes (Signed)
   Covid-19 Vaccination Clinic  Name:  Audrey [name]    MRN: [id number] DOB: 09/17/1933  11/13/2019  [name] was observed post Covid-19 immunization for  15 minutes without incidence. She was provided with Vaccine Information Sheet and instruction to access the V-Safe system.   [name] was instructed to call 911 with any severe reactions post vaccine: Marland Kitchen Difficulty breathing  . Swelling of your face and throat  . A fast heartbeat  . A bad rash all over your body  . Dizziness and weakness    Immunizations Administered    Name Date Dose VIS Date Route   Pfizer COVID-19 Vaccine 11/13/2019  8:50 AM 0.3 mL 09/10/2019 Intramuscular   Manufacturer: [place]   Lot: [id number]   [place]: [id number]

## 2019-11-26 ENCOUNTER — Ambulatory Visit: Payer: Medicare Other | Admitting: Podiatry

## 2019-11-26 ENCOUNTER — Other Ambulatory Visit: Payer: Self-pay

## 2019-11-26 ENCOUNTER — Encounter: Payer: Self-pay | Admitting: Podiatry

## 2019-11-26 VITALS — Temp 96.4°F

## 2019-11-26 DIAGNOSIS — M79672 Pain in left foot: Secondary | ICD-10-CM

## 2019-11-26 DIAGNOSIS — M79675 Pain in left toe(s): Secondary | ICD-10-CM | POA: Diagnosis not present

## 2019-11-26 DIAGNOSIS — M79674 Pain in right toe(s): Secondary | ICD-10-CM | POA: Diagnosis not present

## 2019-11-26 DIAGNOSIS — M62838 Other muscle spasm: Secondary | ICD-10-CM | POA: Diagnosis not present

## 2019-11-26 DIAGNOSIS — B351 Tinea unguium: Secondary | ICD-10-CM

## 2019-11-26 NOTE — Patient Instructions (Signed)

## 2019-11-27 NOTE — Progress Notes (Signed)
Subjective: Audrey [name] presents today for follow up of painful mycotic nails b/l that are difficult to trim. Pain interferes with ambulation. Aggravating factors include wearing enclosed shoe gear. Pain is relieved with periodic professional debridement.   Today, she relates discomfort of left ankle. Duration has been about one month. She relates spasms in her left anterior ankle. Denies any preceding episode of trauma to limb. She has done nothing to treat the condition. She does have history of fracture of right knee about one year ago.  Allergies  Allergen Reactions  . Penicillins Other (See Comments)    Hallucinations Has patient had a PCN reaction causing immediate rash, facial/tongue/throat swelling, SOB or lightheadedness with hypotension:No Has patient had a PCN reaction causing severe rash involving mucus membranes or skin necrosis:No Has patient had a PCN reaction that required hospitalization:No Has patient had a PCN reaction occurring within the last 10 years:NO If all of the above answers are "NO", then may proceed with Cephalosporin use.      Objective: Vitals:   11/26/19 1044  Temp: (!) 96.4 F (35.8 C)    Vascular Examination:  Capillary refill time to digits immediate b/l, palpable DP pulses b/l, palpable PT pulses b/l, pedal hair present b/l and skin temperature gradient within normal limits b/l  Dermatological Examination: Pedal skin with normal turgor, texture and tone bilaterally, no open wounds bilaterally, no interdigital macerations bilaterally and toenails 1-5 b/l elongated, dystrophic, thickened, crumbly with subungual debris.  Musculoskeletal: Normal muscle strength 5/5 to all lower extremity muscle groups bilaterally, no pain crepitus or joint limitation noted with ROM b/l, pes planus deformity noted and genu valgum noted LLE.  Neurological: Protective sensation intact 5/5 intact bilaterally with 10g monofilament b/l and vibratory sensation intact  b/l.  Assessment: 1. Pain due to onychomycosis of toenails of both feet   2. Muscle spasm of left lower extremity   3. Left foot pain    Plan: -Toenails 1-5 b/l were debrided in length and girth with sterile nail nippers and dremel without iatrogenic bleeding. -Refer to [facility] physical therapy for TA spasm left ankle.  -Will check benefits for orthotics -Patient to continue soft, supportive shoe gear daily. -Patient/POA to call should there be question/concern in the interim.  Return in about 9 weeks (around 01/28/2020) for nail trim.

## 2019-12-13 ENCOUNTER — Other Ambulatory Visit: Payer: Self-pay | Admitting: Internal Medicine

## 2019-12-13 DIAGNOSIS — M810 Age-related osteoporosis without current pathological fracture: Secondary | ICD-10-CM

## 2020-02-09 ENCOUNTER — Ambulatory Visit: Payer: Medicare Other | Admitting: Podiatry

## 2020-02-09 ENCOUNTER — Encounter: Payer: Self-pay | Admitting: Podiatry

## 2020-02-09 ENCOUNTER — Other Ambulatory Visit: Payer: Self-pay

## 2020-02-09 VITALS — Temp 98.4°F

## 2020-02-09 DIAGNOSIS — B351 Tinea unguium: Secondary | ICD-10-CM

## 2020-02-09 DIAGNOSIS — M79675 Pain in left toe(s): Secondary | ICD-10-CM

## 2020-02-09 DIAGNOSIS — M79674 Pain in right toe(s): Secondary | ICD-10-CM | POA: Diagnosis not present

## 2020-02-09 DIAGNOSIS — M2142 Flat foot [pes planus] (acquired), left foot: Secondary | ICD-10-CM

## 2020-02-09 DIAGNOSIS — M2141 Flat foot [pes planus] (acquired), right foot: Secondary | ICD-10-CM

## 2020-02-09 NOTE — Patient Instructions (Signed)

## 2020-02-09 NOTE — Progress Notes (Signed)
Subjective: Audrey [name] is a 86 y.o. female patient seen today painful mycotic nails b/l that are difficult to trim. Pain interferes with ambulation. Aggravating factors include wearing enclosed shoe gear. Pain is relieved with periodic professional debridement.  She notes painful left hallux nail she feels is digging into the medial side of her toe. She denies any redness, drainage or swelling. Pain is mostly when she wears enclosed shoe gear.   She states she is wearing [place] shoe gear.  Patient Active Problem List   Diagnosis Date Noted  . Patellar fracture 07/22/2018  . Asthma 07/22/2018  . HLD (hyperlipidemia) 07/22/2018  . Pre-diabetes 07/22/2018  . Essential hypertension 07/22/2018  . Pre-syncope 07/22/2018  . Dizziness 07/22/2018  . Status asthmaticus 11/18/2013  . Asthma with acute exacerbation 10/09/2012    Current Outpatient Medications on File Prior to Visit  Medication Sig Dispense Refill  . acetaminophen (TYLENOL) 325 MG tablet Take 2 tablets (650 mg total) by mouth every 6 (six) hours as needed for mild pain (or Fever >/= 101).    Marland Kitchen albuterol (PROVENTIL HFA;VENTOLIN HFA) 108 (90 BASE) MCG/ACT inhaler Inhale 2 puffs into the lungs every 6 (six) hours as needed for shortness of breath.     Marland Kitchen albuterol (PROVENTIL) (2.5 MG/3ML) 0.083% nebulizer solution Inhale 2.5 mg into the lungs 3 (three) times daily as needed for shortness of breath.  5  . alendronate (FOSAMAX) 70 MG tablet Take 70 mg by mouth every Wednesday.  3  . amLODipine (NORVASC) 5 MG tablet Take 5 mg by mouth daily.    . bisacodyl (DULCOLAX) 10 MG suppository Place 1 suppository (10 mg total) rectally daily as needed for moderate constipation. 12 suppository 0  . diclofenac sodium (VOLTAREN) 1 % GEL Apply 4 g topically 4 (four) times daily. 100 g 2  . lovastatin (MEVACOR) 40 MG tablet Take 40 mg by mouth daily.    Marland Kitchen omega-3 acid ethyl esters (LOVAZA) 1 G capsule Take 1 g by mouth daily. Reported on 04/14/2016     . OVER THE COUNTER MEDICATION Take 1 tablet by mouth daily. brain support vitamin    . polyethylene glycol (MIRALAX / GLYCOLAX) packet Take 17 g by mouth daily as needed for moderate constipation. 14 each 0  . predniSONE (STERAPRED UNI-PAK 21 TAB) 10 MG (21) TBPK tablet TAKE 6 TABLETS ON DAY 1 THEN TAKE 5 TABLETS ON DAY 2 THEN TAKE 4 TABLETS ON DAY 3 THEN TAKE 3 TABLETS ON DAY 4 THEN TAKE 2 TABLETS ON DAY 5    . senna-docusate (SENOKOT-S) 8.6-50 MG tablet Take 1 tablet by mouth at bedtime as needed for mild constipation.    . triamcinolone cream (KENALOG) 0.5 % Apply 1 application topically 2 (two) times daily as needed (itchy skin).      No current facility-administered medications on file prior to visit.    Allergies  Allergen Reactions  . Penicillins Other (See Comments)    Hallucinations Has patient had a PCN reaction causing immediate rash, facial/tongue/throat swelling, SOB or lightheadedness with hypotension:No Has patient had a PCN reaction causing severe rash involving mucus membranes or skin necrosis:No Has patient had a PCN reaction that required hospitalization:No Has patient had a PCN reaction occurring within the last 10 years:NO If all of the above answers are "NO", then may proceed with Cephalosporin use.    Objective: Physical Exam  General: Ms.  Audrey PENDELTON is a pleasant 86 y.o. y.o. AA female, WD,WN in NAD. AAO x 3.  Vascular:  Capillary refill time to digits immediate b/l. Palpable DP pulses b/l. Palpable PT pulses b/l. Pedal hair present b/l. Skin temperature gradient within normal limits b/l. No edema noted b/l.  Dermatological:  Pedal skin with normal turgor, texture and tone bilaterally. No open wounds bilaterally. No interdigital macerations bilaterally. Toenails 1-5 b/l elongated, dystrophic, thickened, crumbly with subungual debris and tenderness to dorsal palpation.  Musculoskeletal:  Normal muscle strength 5/5 to all lower extremity muscle groups  bilaterally. No pain crepitus or joint limitation noted with ROM b/l. Pes planus deformity noted b/l.  Genu valgum left lower extremity.  Neurological:  Protective sensation intact 5/5 intact bilaterally with 10g monofilament b/l.  Assessment and Plan:  1. Pain due to onychomycosis of toenails of both feet   2. Pes planus of both feet    -Examined patient. -Toenails 1-5 b/l were debrided in length and girth with sterile nail nippers and dremel without iatrogenic bleeding.  -Patient to continue soft, supportive shoe gear daily. -Patient to report any pedal injuries to medical professional immediately. Patient given Rx for [employer] for motion control shoe gear. She will be assessed at store for most appropriate shoe gear to prevent pronation. -Patient/POA to call should there be question/concern in the interim.  Return in about 3 months (around 05/11/2020) for nail trim.  Marzetta Board, DPM

## 2020-02-25 ENCOUNTER — Other Ambulatory Visit: Payer: Self-pay

## 2020-02-25 ENCOUNTER — Ambulatory Visit
Admission: RE | Admit: 2020-02-25 | Discharge: 2020-02-25 | Disposition: A | Discharge status: Home / Self Care | Payer: Medicare Other | Source: Ambulatory Visit | Attending: Internal Medicine | Admitting: Internal Medicine

## 2020-02-25 DIAGNOSIS — M810 Age-related osteoporosis without current pathological fracture: Secondary | ICD-10-CM

## 2020-05-16 ENCOUNTER — Encounter: Payer: Self-pay | Admitting: Podiatry

## 2020-05-16 ENCOUNTER — Ambulatory Visit: Payer: Medicare Other | Admitting: Podiatry

## 2020-05-16 ENCOUNTER — Other Ambulatory Visit: Payer: Self-pay

## 2020-05-16 DIAGNOSIS — M79674 Pain in right toe(s): Secondary | ICD-10-CM | POA: Diagnosis not present

## 2020-05-16 DIAGNOSIS — M2141 Flat foot [pes planus] (acquired), right foot: Secondary | ICD-10-CM

## 2020-05-16 DIAGNOSIS — M79675 Pain in left toe(s): Secondary | ICD-10-CM | POA: Diagnosis not present

## 2020-05-16 DIAGNOSIS — B351 Tinea unguium: Secondary | ICD-10-CM

## 2020-05-16 DIAGNOSIS — M2142 Flat foot [pes planus] (acquired), left foot: Secondary | ICD-10-CM

## 2020-05-18 NOTE — Progress Notes (Signed)
Subjective: Audrey [name] is a 86 y.o. female patient seen today painful mycotic nails b/l that are difficult to trim. Pain interferes with ambulation. Aggravating factors include wearing enclosed shoe gear. Pain is relieved with periodic professional debridement.  She notes no new pedal concerns on today's visit.  Patient Active Problem List   Diagnosis Date Noted  . Patellar fracture 07/22/2018  . Asthma 07/22/2018  . HLD (hyperlipidemia) 07/22/2018  . Pre-diabetes 07/22/2018  . Essential hypertension 07/22/2018  . Pre-syncope 07/22/2018  . Dizziness 07/22/2018  . Status asthmaticus 11/18/2013  . Asthma with acute exacerbation 10/09/2012    Current Outpatient Medications on File Prior to Visit  Medication Sig Dispense Refill  . acetaminophen (TYLENOL) 325 MG tablet Take 2 tablets (650 mg total) by mouth every 6 (six) hours as needed for mild pain (or Fever >/= 101).    Marland Kitchen albuterol (PROVENTIL HFA;VENTOLIN HFA) 108 (90 BASE) MCG/ACT inhaler Inhale 2 puffs into the lungs every 6 (six) hours as needed for shortness of breath.     Marland Kitchen albuterol (PROVENTIL) (2.5 MG/3ML) 0.083% nebulizer solution Inhale 2.5 mg into the lungs 3 (three) times daily as needed for shortness of breath.  5  . alendronate (FOSAMAX) 70 MG tablet Take 70 mg by mouth every Wednesday.  3  . amLODipine (NORVASC) 5 MG tablet Take 5 mg by mouth daily.    . bisacodyl (DULCOLAX) 10 MG suppository Place 1 suppository (10 mg total) rectally daily as needed for moderate constipation. 12 suppository 0  . diclofenac sodium (VOLTAREN) 1 % GEL Apply 4 g topically 4 (four) times daily. 100 g 2  . lovastatin (MEVACOR) 40 MG tablet Take 40 mg by mouth daily.    Marland Kitchen omega-3 acid ethyl esters (LOVAZA) 1 G capsule Take 1 g by mouth daily. Reported on 04/14/2016    . OVER THE COUNTER MEDICATION Take 1 tablet by mouth daily. brain support vitamin    . polyethylene glycol (MIRALAX / GLYCOLAX) packet Take 17 g by mouth daily as needed for  moderate constipation. 14 each 0  . predniSONE (STERAPRED UNI-PAK 21 TAB) 10 MG (21) TBPK tablet TAKE 6 TABLETS ON DAY 1 THEN TAKE 5 TABLETS ON DAY 2 THEN TAKE 4 TABLETS ON DAY 3 THEN TAKE 3 TABLETS ON DAY 4 THEN TAKE 2 TABLETS ON DAY 5    . senna-docusate (SENOKOT-S) 8.6-50 MG tablet Take 1 tablet by mouth at bedtime as needed for mild constipation.    . triamcinolone cream (KENALOG) 0.5 % Apply 1 application topically 2 (two) times daily as needed (itchy skin).      No current facility-administered medications on file prior to visit.    Allergies  Allergen Reactions  . Penicillins Other (See Comments)    Hallucinations Has patient had a PCN reaction causing immediate rash, facial/tongue/throat swelling, SOB or lightheadedness with hypotension:No Has patient had a PCN reaction causing severe rash involving mucus membranes or skin necrosis:No Has patient had a PCN reaction that required hospitalization:No Has patient had a PCN reaction occurring within the last 10 years:NO If all of the above answers are "NO", then may proceed with Cephalosporin use.    Objective: Physical Exam  General: Audrey [name] is a pleasant 86 y.o.  AA female, WD,WN in NAD. AAO x 3.   Vascular:  Capillary refill time to digits immediate b/l. Palpable DP pulses b/l. Palpable PT pulses b/l. Pedal hair present b/l. Skin temperature gradient within normal limits b/l. No edema noted b/l.  Dermatological:  Pedal skin with normal turgor, texture and tone bilaterally. No open wounds bilaterally. No interdigital macerations bilaterally. Toenails 1-5 b/l elongated, dystrophic, thickened, crumbly with subungual debris and tenderness to dorsal palpation.  Musculoskeletal:  Normal muscle strength 5/5 to all lower extremity muscle groups bilaterally. No pain crepitus or joint limitation noted with ROM b/l. Pes planus deformity noted b/l.  Genu valgum left lower extremity.  Neurological:  Protective sensation intact  5/5 intact bilaterally with 10g monofilament b/l.  Assessment and Plan:  1. Pain due to onychomycosis of toenails of both feet   2. Pes planus of both feet    -Examined patient. -Toenails 1-5 b/l were debrided in length and girth with sterile nail nippers and dremel without iatrogenic bleeding.  -Patient to continue soft, supportive shoe gear daily. -Patient to report any pedal injuries to medical professional immediately. -Patient/POA to call should there be question/concern in the interim.  Return in about 3 months (around 08/16/2020) for nail trim.  Marzetta Board, DPM

## 2020-07-03 ENCOUNTER — Other Ambulatory Visit: Payer: Self-pay | Admitting: Internal Medicine

## 2020-07-03 ENCOUNTER — Ambulatory Visit
Admission: RE | Admit: 2020-07-03 | Discharge: 2020-07-03 | Disposition: A | Discharge status: Home / Self Care | Payer: Medicare Other | Source: Ambulatory Visit | Attending: Internal Medicine | Admitting: Internal Medicine

## 2020-07-03 DIAGNOSIS — M79672 Pain in left foot: Secondary | ICD-10-CM

## 2020-07-04 ENCOUNTER — Ambulatory Visit: Payer: Medicare Other

## 2020-08-22 ENCOUNTER — Ambulatory Visit: Payer: Medicare Other | Admitting: Podiatry

## 2020-10-10 ENCOUNTER — Encounter: Payer: Self-pay | Admitting: Podiatry

## 2020-10-10 ENCOUNTER — Other Ambulatory Visit: Payer: Self-pay

## 2020-10-10 ENCOUNTER — Ambulatory Visit (INDEPENDENT_AMBULATORY_CARE_PROVIDER_SITE_OTHER): Payer: Medicare Other | Admitting: Podiatry

## 2020-10-10 DIAGNOSIS — D61818 Other pancytopenia: Secondary | ICD-10-CM | POA: Insufficient documentation

## 2020-10-10 DIAGNOSIS — F330 Major depressive disorder, recurrent, mild: Secondary | ICD-10-CM | POA: Insufficient documentation

## 2020-10-10 DIAGNOSIS — D179 Benign lipomatous neoplasm, unspecified: Secondary | ICD-10-CM | POA: Insufficient documentation

## 2020-10-10 DIAGNOSIS — M810 Age-related osteoporosis without current pathological fracture: Secondary | ICD-10-CM | POA: Insufficient documentation

## 2020-10-10 DIAGNOSIS — M79672 Pain in left foot: Secondary | ICD-10-CM

## 2020-10-10 DIAGNOSIS — M2141 Flat foot [pes planus] (acquired), right foot: Secondary | ICD-10-CM

## 2020-10-10 DIAGNOSIS — I890 Lymphedema, not elsewhere classified: Secondary | ICD-10-CM | POA: Insufficient documentation

## 2020-10-10 DIAGNOSIS — G8929 Other chronic pain: Secondary | ICD-10-CM | POA: Insufficient documentation

## 2020-10-10 DIAGNOSIS — B351 Tinea unguium: Secondary | ICD-10-CM

## 2020-10-10 DIAGNOSIS — M79674 Pain in right toe(s): Secondary | ICD-10-CM | POA: Diagnosis not present

## 2020-10-10 DIAGNOSIS — M79675 Pain in left toe(s): Secondary | ICD-10-CM

## 2020-10-10 DIAGNOSIS — F325 Major depressive disorder, single episode, in full remission: Secondary | ICD-10-CM | POA: Insufficient documentation

## 2020-10-10 DIAGNOSIS — L300 Nummular dermatitis: Secondary | ICD-10-CM | POA: Insufficient documentation

## 2020-10-10 DIAGNOSIS — Z79899 Other long term (current) drug therapy: Secondary | ICD-10-CM | POA: Insufficient documentation

## 2020-10-10 DIAGNOSIS — M7742 Metatarsalgia, left foot: Secondary | ICD-10-CM

## 2020-10-10 DIAGNOSIS — R269 Unspecified abnormalities of gait and mobility: Secondary | ICD-10-CM | POA: Insufficient documentation

## 2020-10-10 DIAGNOSIS — M2142 Flat foot [pes planus] (acquired), left foot: Secondary | ICD-10-CM

## 2020-10-10 DIAGNOSIS — M5442 Lumbago with sciatica, left side: Secondary | ICD-10-CM | POA: Insufficient documentation

## 2020-10-10 DIAGNOSIS — K219 Gastro-esophageal reflux disease without esophagitis: Secondary | ICD-10-CM | POA: Insufficient documentation

## 2020-10-10 DIAGNOSIS — M519 Unspecified thoracic, thoracolumbar and lumbosacral intervertebral disc disorder: Secondary | ICD-10-CM | POA: Insufficient documentation

## 2020-10-10 DIAGNOSIS — G629 Polyneuropathy, unspecified: Secondary | ICD-10-CM | POA: Insufficient documentation

## 2020-10-10 DIAGNOSIS — E7800 Pure hypercholesterolemia, unspecified: Secondary | ICD-10-CM | POA: Insufficient documentation

## 2020-10-12 DIAGNOSIS — D61818 Other pancytopenia: Secondary | ICD-10-CM | POA: Diagnosis not present

## 2020-10-12 DIAGNOSIS — K219 Gastro-esophageal reflux disease without esophagitis: Secondary | ICD-10-CM | POA: Diagnosis not present

## 2020-10-12 DIAGNOSIS — E7800 Pure hypercholesterolemia, unspecified: Secondary | ICD-10-CM | POA: Diagnosis not present

## 2020-10-12 DIAGNOSIS — I10 Essential (primary) hypertension: Secondary | ICD-10-CM | POA: Diagnosis not present

## 2020-10-12 DIAGNOSIS — G8929 Other chronic pain: Secondary | ICD-10-CM | POA: Diagnosis not present

## 2020-10-12 DIAGNOSIS — J45909 Unspecified asthma, uncomplicated: Secondary | ICD-10-CM | POA: Diagnosis not present

## 2020-10-12 DIAGNOSIS — J45901 Unspecified asthma with (acute) exacerbation: Secondary | ICD-10-CM | POA: Diagnosis not present

## 2020-10-12 DIAGNOSIS — M810 Age-related osteoporosis without current pathological fracture: Secondary | ICD-10-CM | POA: Diagnosis not present

## 2020-10-13 NOTE — Progress Notes (Signed)
Subjective: Audrey [name] is a 87 y.o. female patient seen today painful mycotic nails b/l that are difficult to trim. Pain interferes with ambulation. Aggravating factors include wearing enclosed shoe gear. Pain is relieved with periodic professional debridement.  Today she relates pain on plantar aspect of left foot x one month. Denies any episodes of trauma and states pain is on and off, not constant.  PCP is Dr. Wenda Low. Last visit was 10/10/2020.  Patient Active Problem List   Diagnosis Date Noted  . Chronic pain 10/10/2020  . Gait abnormality 10/10/2020  . Gastroesophageal reflux disease 10/10/2020  . Lipoma 10/10/2020  . Lymphedema 10/10/2020  . Major depression in remission ([place]) 10/10/2020  . Medication management 10/10/2020  . Mild recurrent major depression ([place]) 10/10/2020  . Neuropathy 10/10/2020  . Nummular eczema 10/10/2020  . Osteoporosis 10/10/2020  . Pancytopenia ([place]) 10/10/2020  . Pure hypercholesterolemia 10/10/2020  . Left-sided low back pain with left-sided sciatica 10/10/2020  . Unspecified thoracic, thoracolumbar and lumbosacral intervertebral disc disorder 10/10/2020  . Patellar fracture 07/22/2018  . Asthma 07/22/2018  . HLD (hyperlipidemia) 07/22/2018  . Pre-diabetes 07/22/2018  . Essential hypertension 07/22/2018  . Pre-syncope 07/22/2018  . Dizziness 07/22/2018  . Status asthmaticus 11/18/2013  . Asthma with acute exacerbation 10/09/2012    Current Outpatient Medications on File Prior to Visit  Medication Sig Dispense Refill  . Calcium Carb-Cholecalciferol (CALCIUM 500 + D) 500-200 MG-UNIT TABS 1 tablet with food    . clotrimazole-betamethasone (LOTRISONE) cream 1 application to affected area    . gabapentin (NEURONTIN) 100 MG capsule 1-2 capsule    . acetaminophen (TYLENOL) 325 MG tablet Take 2 tablets (650 mg total) by mouth every 6 (six) hours as needed for mild pain (or Fever >/= 101).    Marland Kitchen albuterol (PROVENTIL HFA;VENTOLIN HFA) 108  (90 BASE) MCG/ACT inhaler Inhale 2 puffs into the lungs every 6 (six) hours as needed for shortness of breath.     Marland Kitchen albuterol (PROVENTIL) (2.5 MG/3ML) 0.083% nebulizer solution Inhale 2.5 mg into the lungs 3 (three) times daily as needed for shortness of breath.  5  . alendronate (FOSAMAX) 70 MG tablet Take 1 tablet by mouth once a week.    Marland Kitchen amLODipine (NORVASC) 5 MG tablet Take 5 mg by mouth daily.    . bisacodyl (DULCOLAX) 10 MG suppository Place 1 suppository (10 mg total) rectally daily as needed for moderate constipation. 12 suppository 0  . budesonide-formoterol (SYMBICORT) 160-4.5 MCG/ACT inhaler 2 puffs    . diclofenac sodium (VOLTAREN) 1 % GEL Apply 4 g topically 4 (four) times daily. 100 g 2  . lovastatin (MEVACOR) 40 MG tablet Take 40 mg by mouth daily.    Marland Kitchen omega-3 acid ethyl esters (LOVAZA) 1 G capsule Take 1 g by mouth daily. Reported on 04/14/2016    . OVER THE COUNTER MEDICATION Take 1 tablet by mouth daily. brain support vitamin    . polyethylene glycol (MIRALAX / GLYCOLAX) packet Take 17 g by mouth daily as needed for moderate constipation. 14 each 0  . predniSONE (STERAPRED UNI-PAK 21 TAB) 10 MG (21) TBPK tablet TAKE 6 TABLETS ON DAY 1 THEN TAKE 5 TABLETS ON DAY 2 THEN TAKE 4 TABLETS ON DAY 3 THEN TAKE 3 TABLETS ON DAY 4 THEN TAKE 2 TABLETS ON DAY 5    . senna-docusate (SENOKOT-S) 8.6-50 MG tablet Take 1 tablet by mouth at bedtime as needed for mild constipation.    . triamcinolone cream (KENALOG) 0.5 % 1  application sparingly to affected area     No current facility-administered medications on file prior to visit.    Allergies  Allergen Reactions  . Penicillins Other (See Comments)    Hallucinations Has patient had a PCN reaction causing immediate rash, facial/tongue/throat swelling, SOB or lightheadedness with hypotension:No Has patient had a PCN reaction causing severe rash involving mucus membranes or skin necrosis:No Has patient had a PCN reaction that required  hospitalization:No Has patient had a PCN reaction occurring within the last 10 years:NO If all of the above answers are "NO", then may proceed with Cephalosporin use.   Marland Kitchen Penicillin G Benzathine     Other reaction(s): hallucinations   Objective: Physical Exam  General: Ms.  MADDYX [name] is a pleasant 87 y.o.  AA female, WD,WN in NAD. AAO x 3.   Vascular:  Capillary refill time to digits immediate b/l. Palpable DP pulses b/l. Palpable PT pulses b/l. Pedal hair present b/l. Skin temperature gradient within normal limits b/l. No edema noted b/l.  Dermatological:  Pedal skin with normal turgor, texture and tone bilaterally. No open wounds bilaterally. No interdigital macerations bilaterally. Toenails 1-5 b/l elongated, dystrophic, thickened, crumbly with subungual debris and tenderness to dorsal palpation.   Musculoskeletal:  Normal muscle strength 5/5 to all lower extremity muscle groups bilaterally. No pain crepitus or joint limitation noted with ROM b/l. Pes planus deformity noted b/l.  Genu valgum left lower extremity. Pain on palpation submet head 1 left foot. No erythema, no edema, no fluctuance.   Neurological:  Protective sensation intact 5/5 intact bilaterally with 10g monofilament b/l.  Assessment and Plan:  1. Pain due to onychomycosis of toenails of both feet   2. Pes planus of both feet   3. Metatarsalgia of left foot   4. Left foot pain    -Examined patient. -Toenails 1-5 b/l were debrided in length and girth with sterile nail nippers and dremel without iatrogenic bleeding.  -Applied dancers pad to insole of left foot. If she continues to have problems, call and we will xray. -Patient to continue soft, supportive shoe gear daily. -Patient to report any pedal injuries to medical professional immediately. -Patient/POA to call should there be question/concern in the interim.  Return in about 3 months (around 01/08/2021).  Marzetta Board, DPM

## 2020-11-13 DIAGNOSIS — G8929 Other chronic pain: Secondary | ICD-10-CM | POA: Diagnosis not present

## 2020-11-13 DIAGNOSIS — I10 Essential (primary) hypertension: Secondary | ICD-10-CM | POA: Diagnosis not present

## 2020-11-13 DIAGNOSIS — E7800 Pure hypercholesterolemia, unspecified: Secondary | ICD-10-CM | POA: Diagnosis not present

## 2020-11-13 DIAGNOSIS — K219 Gastro-esophageal reflux disease without esophagitis: Secondary | ICD-10-CM | POA: Diagnosis not present

## 2020-11-13 DIAGNOSIS — D61818 Other pancytopenia: Secondary | ICD-10-CM | POA: Diagnosis not present

## 2020-11-13 DIAGNOSIS — J45901 Unspecified asthma with (acute) exacerbation: Secondary | ICD-10-CM | POA: Diagnosis not present

## 2020-11-13 DIAGNOSIS — M810 Age-related osteoporosis without current pathological fracture: Secondary | ICD-10-CM | POA: Diagnosis not present

## 2020-11-13 DIAGNOSIS — J45909 Unspecified asthma, uncomplicated: Secondary | ICD-10-CM | POA: Diagnosis not present

## 2020-12-18 DIAGNOSIS — I890 Lymphedema, not elsewhere classified: Secondary | ICD-10-CM | POA: Diagnosis not present

## 2020-12-18 DIAGNOSIS — R269 Unspecified abnormalities of gait and mobility: Secondary | ICD-10-CM | POA: Diagnosis not present

## 2020-12-18 DIAGNOSIS — M810 Age-related osteoporosis without current pathological fracture: Secondary | ICD-10-CM | POA: Diagnosis not present

## 2020-12-18 DIAGNOSIS — Z0000 Encounter for general adult medical examination without abnormal findings: Secondary | ICD-10-CM | POA: Diagnosis not present

## 2020-12-18 DIAGNOSIS — D179 Benign lipomatous neoplasm, unspecified: Secondary | ICD-10-CM | POA: Diagnosis not present

## 2020-12-18 DIAGNOSIS — J45909 Unspecified asthma, uncomplicated: Secondary | ICD-10-CM | POA: Diagnosis not present

## 2020-12-18 DIAGNOSIS — R7303 Prediabetes: Secondary | ICD-10-CM | POA: Diagnosis not present

## 2020-12-18 DIAGNOSIS — E7800 Pure hypercholesterolemia, unspecified: Secondary | ICD-10-CM | POA: Diagnosis not present

## 2020-12-18 DIAGNOSIS — I10 Essential (primary) hypertension: Secondary | ICD-10-CM | POA: Diagnosis not present

## 2020-12-18 DIAGNOSIS — K219 Gastro-esophageal reflux disease without esophagitis: Secondary | ICD-10-CM | POA: Diagnosis not present

## 2020-12-27 DIAGNOSIS — E7800 Pure hypercholesterolemia, unspecified: Secondary | ICD-10-CM | POA: Diagnosis not present

## 2020-12-27 DIAGNOSIS — K219 Gastro-esophageal reflux disease without esophagitis: Secondary | ICD-10-CM | POA: Diagnosis not present

## 2020-12-27 DIAGNOSIS — M810 Age-related osteoporosis without current pathological fracture: Secondary | ICD-10-CM | POA: Diagnosis not present

## 2020-12-27 DIAGNOSIS — I10 Essential (primary) hypertension: Secondary | ICD-10-CM | POA: Diagnosis not present

## 2020-12-27 DIAGNOSIS — J45901 Unspecified asthma with (acute) exacerbation: Secondary | ICD-10-CM | POA: Diagnosis not present

## 2020-12-27 DIAGNOSIS — J45909 Unspecified asthma, uncomplicated: Secondary | ICD-10-CM | POA: Diagnosis not present

## 2020-12-27 DIAGNOSIS — G8929 Other chronic pain: Secondary | ICD-10-CM | POA: Diagnosis not present

## 2021-01-05 ENCOUNTER — Encounter: Payer: Self-pay | Admitting: Podiatry

## 2021-01-05 ENCOUNTER — Other Ambulatory Visit: Payer: Self-pay

## 2021-01-05 ENCOUNTER — Ambulatory Visit: Payer: Medicare Other | Admitting: Podiatry

## 2021-01-05 DIAGNOSIS — M79674 Pain in right toe(s): Secondary | ICD-10-CM | POA: Diagnosis not present

## 2021-01-05 DIAGNOSIS — M19072 Primary osteoarthritis, left ankle and foot: Secondary | ICD-10-CM | POA: Diagnosis not present

## 2021-01-05 DIAGNOSIS — B351 Tinea unguium: Secondary | ICD-10-CM | POA: Diagnosis not present

## 2021-01-05 DIAGNOSIS — M2141 Flat foot [pes planus] (acquired), right foot: Secondary | ICD-10-CM

## 2021-01-05 DIAGNOSIS — M79675 Pain in left toe(s): Secondary | ICD-10-CM

## 2021-01-05 NOTE — Patient Instructions (Addendum)
Apply Voltaren Gel: Apply  2 grams 1% to top of left foot up to three times per day.

## 2021-01-08 NOTE — Progress Notes (Signed)
Subjective: Audrey [name] is a 87 y.o. female patient seen today painful mycotic nails b/l that are difficult to trim. Pain interferes with ambulation. Aggravating factors include wearing enclosed shoe gear. Pain is relieved with periodic professional debridement.  She continues to have pain on dorsal aspect of left foot.   PCP is Dr. Wenda Low. Last visit was 12/27/2020.  Allergies  Allergen Reactions  . Penicillins Other (See Comments)    Hallucinations Has patient had a PCN reaction causing immediate rash, facial/tongue/throat swelling, SOB or lightheadedness with hypotension:No Has patient had a PCN reaction causing severe rash involving mucus membranes or skin necrosis:No Has patient had a PCN reaction that required hospitalization:No Has patient had a PCN reaction occurring within the last 10 years:NO If all of the above answers are "NO", then may proceed with Cephalosporin use.   Marland Kitchen Penicillin G Benzathine     Other reaction(s): hallucinations   Objective: Physical Exam  General: Ms.  [name] is a pleasant 87 y.o.  AA female, WD,WN in NAD. AAO x 3.   Vascular:  Capillary refill time to digits immediate b/l. Palpable DP pulses b/l. Palpable PT pulses b/l. Pedal hair present b/l. Skin temperature gradient within normal limits b/l. No edema noted b/l.  Dermatological:  Pedal skin with normal turgor, texture and tone bilaterally. No open wounds bilaterally. No interdigital macerations bilaterally. Toenails 1-5 b/l elongated, dystrophic, thickened, crumbly with subungual debris and tenderness to dorsal palpation.   Musculoskeletal:  Normal muscle strength 5/5 to all lower extremity muscle groups bilaterally. No pain crepitus or joint limitation noted with ROM b/l. Pes planus deformity noted b/l.  Genu valgum left lower extremity. Palpable bony prominence noted dorsal midfoot. No surrounding erythema, no edema, no drainage, no fluctuance. Pain on palpation submet head 1  left foot. No erythema, no edema, no fluctuance.  Neurological:  Protective sensation intact 5/5 intact bilaterally with 10g monofilament b/l.  Assessment and Plan:  1. Pain due to onychomycosis of toenails of both feet   2. Pes planus of both feet   3. Primary osteoarthritis of left foot    -Examined patient. -She has Voltaren Gel and I have instructed her to apply Volaren Gel to top of left foot up to three times daily for pain. Avoid any tight shoe gear over bony prominence of left foot as it could irritate her nerve and cause more pain. -Toenails 1-5 b/l were debrided in length and girth with sterile nail nippers and dremel without iatrogenic bleeding.  -Patient to continue soft, supportive shoe gear daily. -Patient to report any pedal injuries to medical professional immediately. -Patient/POA to call should there be question/concern in the interim.  Return in about 3 months (around 04/06/2021).  Marzetta Board, DPM

## 2021-01-30 DIAGNOSIS — J4541 Moderate persistent asthma with (acute) exacerbation: Secondary | ICD-10-CM | POA: Diagnosis not present

## 2021-02-05 DIAGNOSIS — K219 Gastro-esophageal reflux disease without esophagitis: Secondary | ICD-10-CM | POA: Diagnosis not present

## 2021-02-05 DIAGNOSIS — J45901 Unspecified asthma with (acute) exacerbation: Secondary | ICD-10-CM | POA: Diagnosis not present

## 2021-02-05 DIAGNOSIS — D61818 Other pancytopenia: Secondary | ICD-10-CM | POA: Diagnosis not present

## 2021-02-05 DIAGNOSIS — J45909 Unspecified asthma, uncomplicated: Secondary | ICD-10-CM | POA: Diagnosis not present

## 2021-02-05 DIAGNOSIS — J4541 Moderate persistent asthma with (acute) exacerbation: Secondary | ICD-10-CM | POA: Diagnosis not present

## 2021-02-05 DIAGNOSIS — M810 Age-related osteoporosis without current pathological fracture: Secondary | ICD-10-CM | POA: Diagnosis not present

## 2021-02-05 DIAGNOSIS — E7800 Pure hypercholesterolemia, unspecified: Secondary | ICD-10-CM | POA: Diagnosis not present

## 2021-02-05 DIAGNOSIS — G8929 Other chronic pain: Secondary | ICD-10-CM | POA: Diagnosis not present

## 2021-02-05 DIAGNOSIS — I10 Essential (primary) hypertension: Secondary | ICD-10-CM | POA: Diagnosis not present

## 2021-03-16 DIAGNOSIS — K219 Gastro-esophageal reflux disease without esophagitis: Secondary | ICD-10-CM | POA: Diagnosis not present

## 2021-03-16 DIAGNOSIS — J45909 Unspecified asthma, uncomplicated: Secondary | ICD-10-CM | POA: Diagnosis not present

## 2021-03-16 DIAGNOSIS — J4541 Moderate persistent asthma with (acute) exacerbation: Secondary | ICD-10-CM | POA: Diagnosis not present

## 2021-03-16 DIAGNOSIS — J45901 Unspecified asthma with (acute) exacerbation: Secondary | ICD-10-CM | POA: Diagnosis not present

## 2021-03-16 DIAGNOSIS — D61818 Other pancytopenia: Secondary | ICD-10-CM | POA: Diagnosis not present

## 2021-03-16 DIAGNOSIS — M810 Age-related osteoporosis without current pathological fracture: Secondary | ICD-10-CM | POA: Diagnosis not present

## 2021-03-16 DIAGNOSIS — I10 Essential (primary) hypertension: Secondary | ICD-10-CM | POA: Diagnosis not present

## 2021-03-16 DIAGNOSIS — G8929 Other chronic pain: Secondary | ICD-10-CM | POA: Diagnosis not present

## 2021-03-16 DIAGNOSIS — E7800 Pure hypercholesterolemia, unspecified: Secondary | ICD-10-CM | POA: Diagnosis not present

## 2021-03-23 DIAGNOSIS — M79672 Pain in left foot: Secondary | ICD-10-CM | POA: Diagnosis not present

## 2021-04-23 DIAGNOSIS — I10 Essential (primary) hypertension: Secondary | ICD-10-CM | POA: Diagnosis not present

## 2021-04-23 DIAGNOSIS — D61818 Other pancytopenia: Secondary | ICD-10-CM | POA: Diagnosis not present

## 2021-04-23 DIAGNOSIS — J4541 Moderate persistent asthma with (acute) exacerbation: Secondary | ICD-10-CM | POA: Diagnosis not present

## 2021-04-23 DIAGNOSIS — G8929 Other chronic pain: Secondary | ICD-10-CM | POA: Diagnosis not present

## 2021-04-23 DIAGNOSIS — K219 Gastro-esophageal reflux disease without esophagitis: Secondary | ICD-10-CM | POA: Diagnosis not present

## 2021-04-23 DIAGNOSIS — M810 Age-related osteoporosis without current pathological fracture: Secondary | ICD-10-CM | POA: Diagnosis not present

## 2021-04-24 DIAGNOSIS — J45909 Unspecified asthma, uncomplicated: Secondary | ICD-10-CM | POA: Diagnosis not present

## 2021-04-24 DIAGNOSIS — R21 Rash and other nonspecific skin eruption: Secondary | ICD-10-CM | POA: Diagnosis not present

## 2021-04-27 ENCOUNTER — Ambulatory Visit: Payer: Medicare Other | Admitting: Podiatry

## 2021-04-27 ENCOUNTER — Encounter: Payer: Self-pay | Admitting: Podiatry

## 2021-04-27 ENCOUNTER — Other Ambulatory Visit: Payer: Self-pay

## 2021-04-27 DIAGNOSIS — M79674 Pain in right toe(s): Secondary | ICD-10-CM

## 2021-04-27 DIAGNOSIS — M79675 Pain in left toe(s): Secondary | ICD-10-CM

## 2021-04-27 DIAGNOSIS — B351 Tinea unguium: Secondary | ICD-10-CM

## 2021-04-27 NOTE — Progress Notes (Addendum)
  Subjective:  Patient ID: Audrey [name], female    DOB: 01/11/1933,  MRN: [id number]  Audrey [name] presents to clinic today for painful thick toenails that are difficult to trim. Pain interferes with ambulation. Aggravating factors include wearing enclosed shoe gear. Pain is relieved with periodic professional debridement.  She states her nails are long, especially the great toes. Denies any new pedal problems on today's visit.  PCP is Wenda Low, MD , and last visit was 12/18/2020.  Allergies  Allergen Reactions   Penicillins Other (See Comments)    Hallucinations Has patient had a PCN reaction causing immediate rash, facial/tongue/throat swelling, SOB or lightheadedness with hypotension:No Has patient had a PCN reaction causing severe rash involving mucus membranes or skin necrosis:No Has patient had a PCN reaction that required hospitalization:No Has patient had a PCN reaction occurring within the last 10 years:NO If all of the above answers are "NO", then may proceed with Cephalosporin use.    Penicillin G Benzathine     Other reaction(s): hallucinations    Review of Systems: Negative except as noted in the HPI. Objective:   Constitutional Audrey [name] is a pleasant 87 y.o. African American female, WD, WN in NAD. AAO x 3.   Vascular Capillary refill time to digits immediate b/l. Palpable pedal pulses b/l LE. Pedal hair present. Lower extremity skin temperature gradient within normal limits. No pain with calf compression b/l. Trace edema noted b/l lower extremities. Evidence of chronic venous insufficiency b/l lower extremities. No cyanosis or clubbing noted.  Neurologic Normal speech. Oriented to person, place, and time. Protective sensation intact 5/5 intact bilaterally with 10g monofilament b/l.  Dermatologic Pedal skin with normal turgor, texture and tone b/l lower extremities. No open wounds b/l lower extremities. No interdigital macerations b/l lower extremities.  Toenails 1-5 b/l elongated, discolored, dystrophic, thickened, crumbly with subungual debris and tenderness to dorsal palpation.  Orthopedic: Normal muscle strength 5/5 to all lower extremity muscle groups bilaterally. No pain crepitus or joint limitation noted with ROM b/l. Hallux valgus with bunion deformity noted b/l lower extremities. Pes planus deformity noted b/l.  Patient ambulates independent of any assistive aids.   Radiographs: None Assessment:   1. Pain due to onychomycosis of toenails of both feet    Plan:  -No new findings. No new orders. -Patient to continue soft, supportive shoe gear daily. -Toenails 1-5 b/l were debrided in length and girth with sterile nail nippers and dremel without iatrogenic bleeding.  -Patient to report any pedal injuries to medical professional immediately. -Patient/POA to call should there be question/concern in the interim.  Return in about 3 months (around 07/28/2021).  Marzetta Board, DPM

## 2021-06-25 DIAGNOSIS — I10 Essential (primary) hypertension: Secondary | ICD-10-CM | POA: Diagnosis not present

## 2021-06-25 DIAGNOSIS — E7800 Pure hypercholesterolemia, unspecified: Secondary | ICD-10-CM | POA: Diagnosis not present

## 2021-06-25 DIAGNOSIS — D179 Benign lipomatous neoplasm, unspecified: Secondary | ICD-10-CM | POA: Diagnosis not present

## 2021-06-25 DIAGNOSIS — J45909 Unspecified asthma, uncomplicated: Secondary | ICD-10-CM | POA: Diagnosis not present

## 2021-06-25 DIAGNOSIS — R0609 Other forms of dyspnea: Secondary | ICD-10-CM | POA: Diagnosis not present

## 2021-06-25 DIAGNOSIS — Z23 Encounter for immunization: Secondary | ICD-10-CM | POA: Diagnosis not present

## 2021-06-25 DIAGNOSIS — R7303 Prediabetes: Secondary | ICD-10-CM | POA: Diagnosis not present

## 2021-06-26 DIAGNOSIS — J4541 Moderate persistent asthma with (acute) exacerbation: Secondary | ICD-10-CM | POA: Diagnosis not present

## 2021-06-26 DIAGNOSIS — I10 Essential (primary) hypertension: Secondary | ICD-10-CM | POA: Diagnosis not present

## 2021-06-26 DIAGNOSIS — E7800 Pure hypercholesterolemia, unspecified: Secondary | ICD-10-CM | POA: Diagnosis not present

## 2021-06-26 DIAGNOSIS — G8929 Other chronic pain: Secondary | ICD-10-CM | POA: Diagnosis not present

## 2021-06-26 DIAGNOSIS — J45909 Unspecified asthma, uncomplicated: Secondary | ICD-10-CM | POA: Diagnosis not present

## 2021-06-26 DIAGNOSIS — K219 Gastro-esophageal reflux disease without esophagitis: Secondary | ICD-10-CM | POA: Diagnosis not present

## 2021-06-26 DIAGNOSIS — J45901 Unspecified asthma with (acute) exacerbation: Secondary | ICD-10-CM | POA: Diagnosis not present

## 2021-07-06 DIAGNOSIS — R0609 Other forms of dyspnea: Secondary | ICD-10-CM | POA: Diagnosis not present

## 2021-08-03 ENCOUNTER — Other Ambulatory Visit: Payer: Self-pay

## 2021-08-03 ENCOUNTER — Encounter: Payer: Self-pay | Admitting: Podiatry

## 2021-08-03 ENCOUNTER — Ambulatory Visit: Payer: Medicare Other | Admitting: Podiatry

## 2021-08-03 DIAGNOSIS — G4762 Sleep related leg cramps: Secondary | ICD-10-CM | POA: Diagnosis not present

## 2021-08-03 DIAGNOSIS — M79675 Pain in left toe(s): Secondary | ICD-10-CM

## 2021-08-03 DIAGNOSIS — B351 Tinea unguium: Secondary | ICD-10-CM

## 2021-08-03 DIAGNOSIS — M79674 Pain in right toe(s): Secondary | ICD-10-CM

## 2021-08-07 NOTE — Progress Notes (Signed)
Subjective: Audrey [name] is a pleasant 87 y.o. female patient seen today for thick, elongated toenails both feet which are tender when wearing enclosed shoe gear.   Patient relates she has been experiencing leg cramps at night for the past few weeks at night. She admits to not hydrating as much as she should.   PCP is Wenda Low, MD. Last visit was: 08/01/2021.  Allergies  Allergen Reactions   Penicillins Other (See Comments)    Hallucinations Has patient had a PCN reaction causing immediate rash, facial/tongue/throat swelling, SOB or lightheadedness with hypotension:No Has patient had a PCN reaction causing severe rash involving mucus membranes or skin necrosis:No Has patient had a PCN reaction that required hospitalization:No Has patient had a PCN reaction occurring within the last 10 years:NO If all of the above answers are "NO", then may proceed with Cephalosporin use.    Penicillin G Benzathine     Other reaction(s): hallucinations   Prednisone     Other reaction(s): sore/swollen tongue   Objective: Physical Exam  General: Audrey [name] is a pleasant 87 y.o. African American female,  WD, WN in NAD. AAO x 3.   Vascular:  Lower extremity skin temperature gradient within normal limits. Trace edema noted BLE. CFT immediate b/l feet. DP/PT pulses palpable b/l. Digital hair present b/l. No pain with calf compression b/l. No cyanosis nor clubbing noted b/l.  Dermatological:  Toenails 1-5 left and 1-5 right elongated, discolored, dystrophic, thickened, and crumbly with subungual debris and tenderness to dorsal palpation. Pedal skin warm and supple b/l. No interdigital macerations noted b/l. No open wounds b/l.  Musculoskeletal:  HAV with bunion deformity noted b/l LE. Pes planus deformity noted b/l lower extremities. Muscle strength 5/5 to all muscle groups b/l. No pain, crepitus, nor joint limitation noted with ROM b/l.   Neurological:  Protective sensation intact with 10  gram monofilament b/l. Vibratory sensation intact b/l.   Assessment and Plan:  1. Pain due to onychomycosis of toenails of both feet   2. Nocturnal leg cramps    Patient was evaluated and treated and all questions answered. Consent given for treatment as described below: -Examined patient. -We discussed leg cramps could indicate an electrolyte imbalance. She has no acute findings on examination today. She states she will drink more water and will contact Dr. Lysle Rubens if symptoms continue. -Toenails 1-5 b/l were debrided in length and girth with sterile nail nippers and dremel without iatrogenic bleeding.  -Patient/POA to call should there be question/concern in the interim.  Return in about 3 months (around 11/03/2021).  Marzetta Board, DPM

## 2021-08-20 DIAGNOSIS — I10 Essential (primary) hypertension: Secondary | ICD-10-CM | POA: Diagnosis not present

## 2021-08-20 DIAGNOSIS — E7800 Pure hypercholesterolemia, unspecified: Secondary | ICD-10-CM | POA: Diagnosis not present

## 2021-08-20 DIAGNOSIS — J45909 Unspecified asthma, uncomplicated: Secondary | ICD-10-CM | POA: Diagnosis not present

## 2021-08-20 DIAGNOSIS — J4541 Moderate persistent asthma with (acute) exacerbation: Secondary | ICD-10-CM | POA: Diagnosis not present

## 2021-10-26 DIAGNOSIS — M79604 Pain in right leg: Secondary | ICD-10-CM | POA: Diagnosis not present

## 2021-10-26 DIAGNOSIS — R0989 Other specified symptoms and signs involving the circulatory and respiratory systems: Secondary | ICD-10-CM | POA: Diagnosis not present

## 2021-10-26 DIAGNOSIS — M79672 Pain in left foot: Secondary | ICD-10-CM | POA: Diagnosis not present

## 2021-10-26 DIAGNOSIS — Z03818 Encounter for observation for suspected exposure to other biological agents ruled out: Secondary | ICD-10-CM | POA: Diagnosis not present

## 2021-10-26 DIAGNOSIS — R6889 Other general symptoms and signs: Secondary | ICD-10-CM | POA: Diagnosis not present

## 2021-10-26 DIAGNOSIS — J3489 Other specified disorders of nose and nasal sinuses: Secondary | ICD-10-CM | POA: Diagnosis not present

## 2021-10-26 DIAGNOSIS — J4541 Moderate persistent asthma with (acute) exacerbation: Secondary | ICD-10-CM | POA: Diagnosis not present

## 2021-11-09 DIAGNOSIS — M79604 Pain in right leg: Secondary | ICD-10-CM | POA: Diagnosis not present

## 2021-11-09 DIAGNOSIS — I70203 Unspecified atherosclerosis of native arteries of extremities, bilateral legs: Secondary | ICD-10-CM | POA: Diagnosis not present

## 2021-11-09 DIAGNOSIS — M79672 Pain in left foot: Secondary | ICD-10-CM | POA: Diagnosis not present

## 2021-11-09 DIAGNOSIS — R0989 Other specified symptoms and signs involving the circulatory and respiratory systems: Secondary | ICD-10-CM | POA: Diagnosis not present

## 2021-11-09 DIAGNOSIS — R6889 Other general symptoms and signs: Secondary | ICD-10-CM | POA: Diagnosis not present

## 2021-11-20 ENCOUNTER — Encounter: Payer: Self-pay | Admitting: Podiatry

## 2021-11-20 ENCOUNTER — Ambulatory Visit: Payer: Medicare Other | Admitting: Podiatry

## 2021-11-20 ENCOUNTER — Other Ambulatory Visit: Payer: Self-pay

## 2021-11-20 DIAGNOSIS — M79674 Pain in right toe(s): Secondary | ICD-10-CM

## 2021-11-20 DIAGNOSIS — M79675 Pain in left toe(s): Secondary | ICD-10-CM | POA: Diagnosis not present

## 2021-11-20 DIAGNOSIS — B351 Tinea unguium: Secondary | ICD-10-CM

## 2021-11-22 DIAGNOSIS — R600 Localized edema: Secondary | ICD-10-CM | POA: Diagnosis not present

## 2021-11-22 DIAGNOSIS — G629 Polyneuropathy, unspecified: Secondary | ICD-10-CM | POA: Diagnosis not present

## 2021-11-22 DIAGNOSIS — I10 Essential (primary) hypertension: Secondary | ICD-10-CM | POA: Diagnosis not present

## 2021-11-22 DIAGNOSIS — R7303 Prediabetes: Secondary | ICD-10-CM | POA: Diagnosis not present

## 2021-11-25 NOTE — Progress Notes (Signed)
Subjective: Audrey [name] is a 88 y.o. female patient seen today for follow up of  painful elongated mycotic toenails 1-5 bilaterally which are tender when wearing enclosed shoe gear. Pain is relieved with periodic professional debridement..   New problem(s)/concern(s) today: None    PCP is Wenda Low, MD. Last visit was: 10/02/2021.  Allergies  Allergen Reactions   Penicillins Other (See Comments)    Hallucinations Has patient had a PCN reaction causing immediate rash, facial/tongue/throat swelling, SOB or lightheadedness with hypotension:No Has patient had a PCN reaction causing severe rash involving mucus membranes or skin necrosis:No Has patient had a PCN reaction that required hospitalization:No Has patient had a PCN reaction occurring within the last 10 years:NO If all of the above answers are "NO", then may proceed with Cephalosporin use.    Penicillin G Benzathine     Other reaction(s): hallucinations   Prednisone     Other reaction(s): sore/swollen tongue    Objective: Physical Exam  General: Patient is a pleasant 88 y.o. African American female in NAD. AAO x 3.   Neurovascular Examination: Capillary refill time to digits immediate b/l. Palpable pedal pulses b/l LE. Pedal hair present. No pain with calf compression b/l. Lower extremity skin temperature gradient within normal limits. Trace edema noted BLE. No cyanosis or clubbing noted b/l LE.  Protective sensation intact 5/5 intact bilaterally with 10g monofilament b/l. Vibratory sensation intact b/l.  Dermatological:  Pedal integument with normal turgor, texture and tone BLE. No open wounds b/l LE. No interdigital macerations noted b/l LE. Toenails 1-5 b/l elongated, discolored, dystrophic, thickened, crumbly with subungual debris and tenderness to dorsal palpation. No hyperkeratotic nor porokeratotic lesions present on today's visit.  Musculoskeletal:  Muscle strength 5/5 to all lower extremity muscle groups  bilaterally. No pain, crepitus or joint limitation noted with ROM bilateral LE. HAV with bunion deformity noted b/l LE. Pes planus deformity noted bilateral LE.  RESULTS  RESULTS Name Result Date Reference Range  [facility] Arterial Duplex, Lower Extremity [id number]   2021-11-10      ** RADIOLOGY REPORT BY [facility] **                 CLINICAL DATA: Abnormal pulses, bilateral leg pain                 EXAM:        BILATERAL LOWER EXTREMITY ARTERIAL DUPLEX SCAN                 TECHNIQUE:        Gray-scale sonography as well as color Doppler and duplex ultrasound        was performed to evaluate the arteries of both lower extremities        including the common, superficial and profunda femoral arteries,        popliteal artery and calf arteries.                 COMPARISON: None.                 FINDINGS:        Right Lower Extremity                 Inflow: Normal common femoral arterial waveforms and velocities. No        evidence of inflow (aortoiliac) disease.                 Outflow: Normal profunda femoral, superficial femoral and popliteal  arterial waveforms and velocities. No focal elevation of the PSV to        suggest stenosis.                 Runoff: Normal posterior and anterior tibial arterial waveforms and        velocities. Vessels are patent to the ankle.                 Left Lower Extremity                 Inflow: Normal common femoral arterial waveforms and velocities. No        evidence of inflow (aortoiliac) disease.                 Outflow: Normal profunda femoral, superficial femoral and popliteal        arterial waveforms and velocities. No focal elevation of the PSV to        suggest stenosis.                 Runoff: Normal posterior and anterior tibial arterial waveforms and        velocities. Vessels are patent to the ankle.                 IMPRESSION:        Scattered atherosclerotic plaque seen bilaterally.                 No  significant elevation in the peak systolic velocity to suggest a        hemodynamically significant stenosis. No occlusion identified.                 Signed,                 Criselda Peaches, MD, [place]                 [facility]                 [facility]                          Electronically Signed        By: Jacqulynn Cadet M.D.        On: 11/10/2021 06:25                 Diagnostic images are available in [facility] PACS for this exam.      Assessment: 1. Pain due to onychomycosis of toenails of both feet    Plan: Patient was evaluated and treated and all questions answered. Consent given for treatment as described below: -Mycotic toenails 1-5 bilaterally were debrided in length and girth with sterile nail nippers and dremel without incident. -Patient/POA to call should there be question/concern in the interim.  Return in about 3 months (around 02/17/2022).  Marzetta Board, DPM

## 2021-12-04 DIAGNOSIS — I10 Essential (primary) hypertension: Secondary | ICD-10-CM | POA: Diagnosis not present

## 2021-12-04 DIAGNOSIS — E7800 Pure hypercholesterolemia, unspecified: Secondary | ICD-10-CM | POA: Diagnosis not present

## 2021-12-04 DIAGNOSIS — J45909 Unspecified asthma, uncomplicated: Secondary | ICD-10-CM | POA: Diagnosis not present

## 2021-12-19 ENCOUNTER — Other Ambulatory Visit: Payer: Self-pay

## 2021-12-19 ENCOUNTER — Ambulatory Visit: Payer: Medicare Other | Admitting: Podiatry

## 2021-12-19 ENCOUNTER — Encounter: Payer: Self-pay | Admitting: Podiatry

## 2021-12-19 DIAGNOSIS — M79675 Pain in left toe(s): Secondary | ICD-10-CM

## 2021-12-19 DIAGNOSIS — L600 Ingrowing nail: Secondary | ICD-10-CM

## 2021-12-24 DIAGNOSIS — J45909 Unspecified asthma, uncomplicated: Secondary | ICD-10-CM | POA: Diagnosis not present

## 2021-12-24 DIAGNOSIS — D179 Benign lipomatous neoplasm, unspecified: Secondary | ICD-10-CM | POA: Diagnosis not present

## 2021-12-24 DIAGNOSIS — E7800 Pure hypercholesterolemia, unspecified: Secondary | ICD-10-CM | POA: Diagnosis not present

## 2021-12-24 DIAGNOSIS — D61818 Other pancytopenia: Secondary | ICD-10-CM | POA: Diagnosis not present

## 2021-12-24 DIAGNOSIS — M519 Unspecified thoracic, thoracolumbar and lumbosacral intervertebral disc disorder: Secondary | ICD-10-CM | POA: Diagnosis not present

## 2021-12-24 DIAGNOSIS — R7303 Prediabetes: Secondary | ICD-10-CM | POA: Diagnosis not present

## 2021-12-24 DIAGNOSIS — I10 Essential (primary) hypertension: Secondary | ICD-10-CM | POA: Diagnosis not present

## 2021-12-24 DIAGNOSIS — G629 Polyneuropathy, unspecified: Secondary | ICD-10-CM | POA: Diagnosis not present

## 2021-12-24 DIAGNOSIS — Z0000 Encounter for general adult medical examination without abnormal findings: Secondary | ICD-10-CM | POA: Diagnosis not present

## 2021-12-24 DIAGNOSIS — M810 Age-related osteoporosis without current pathological fracture: Secondary | ICD-10-CM | POA: Diagnosis not present

## 2021-12-27 NOTE — Progress Notes (Signed)
?  Subjective:  ?Patient ID: Audrey [name], female    DOB: Oct 16, 1932,  MRN: [id number] ? ?SONOMA FIRKUS presents to clinic today for  chief concern of left great toe pain. Patient states pain started on Sunday. She has had difficulty wearing enclosed shoe gear. She denies any redness, drainage or swelling. ? ?PCP is Wenda Low, MD , and last visit was November 22, 2021. ? ?Allergies  ?Allergen Reactions  ? Penicillins Other (See Comments)  ?  Hallucinations ?Has patient had a PCN reaction causing immediate rash, facial/tongue/throat swelling, SOB or lightheadedness with hypotension:No ?Has patient had a PCN reaction causing severe rash involving mucus membranes or skin necrosis:No ?Has patient had a PCN reaction that required hospitalization:No ?Has patient had a PCN reaction occurring within the last 10 years:NO ?If all of the above answers are "NO", then may proceed with Cephalosporin use. ?  ? Penicillin G Benzathine   ?  Other reaction(s): hallucinations  ? Prednisone   ?  Other reaction(s): sore/swollen tongue  ? ? ?Review of Systems: Negative except as noted in the HPI. ? ?Objective:  ?General: Patient is a pleasant 88 y.o. African American female in NAD. AAO x 3.  ? ?Neurovascular Examination: ?Capillary refill time to digits immediate b/l. Palpable pedal pulses b/l LE. Pedal hair present. No pain with calf compression b/l. Lower extremity skin temperature gradient within normal limits. Trace edema noted BLE. No cyanosis or clubbing noted b/l LE. ? ?Protective sensation intact 5/5 intact bilaterally with 10g monofilament b/l. Vibratory sensation intact b/l. ? ?Dermatological:  ?Pedal integument with normal turgor, texture and tone BLE. No open wounds b/l LE. No interdigital macerations noted b/l LE. Toenails 1-5 b/l recently debrided. Incurvated nailplate left great toe medial border(s) with tenderness to palpation. No erythema, no edema, no drainage noted.  No hyperkeratotic nor porokeratotic lesions  present on today's visit. ? ?Musculoskeletal:  ?Muscle strength 5/5 to all lower extremity muscle groups bilaterally. No pain, crepitus or joint limitation noted with ROM bilateral LE. HAV with bunion deformity noted b/l LE. Pes planus deformity noted bilateral LE. ? ?Assessment/Plan: ?1. Ingrown toenail without infection   ?2. Toe pain, left   ? ?-Patient was evaluated and treated. All patient's and/or POA's questions/concerns answered on today's visit. ?-Offending nail border debrided and curretaged L hallux utilizing sterile nail nipper and currette. Border(s) cleansed with alcohol and triple antibiotic ointment applied. Patient instructed to apply Neosporin Cream  to L hallux once daily for 7 days. ?-Patient/POA to call should there be question/concern in the interim.  ? ?Keep upcoming appointment on 02/19/2022. ? ?Marzetta Board, DPM  ?

## 2022-01-22 DIAGNOSIS — I10 Essential (primary) hypertension: Secondary | ICD-10-CM | POA: Diagnosis not present

## 2022-01-22 DIAGNOSIS — J4541 Moderate persistent asthma with (acute) exacerbation: Secondary | ICD-10-CM | POA: Diagnosis not present

## 2022-01-22 DIAGNOSIS — E7800 Pure hypercholesterolemia, unspecified: Secondary | ICD-10-CM | POA: Diagnosis not present

## 2022-02-14 ENCOUNTER — Other Ambulatory Visit: Payer: Self-pay | Admitting: Internal Medicine

## 2022-02-14 ENCOUNTER — Ambulatory Visit
Admission: RE | Admit: 2022-02-14 | Discharge: 2022-02-14 | Disposition: A | Discharge status: Home / Self Care | Payer: Medicare Other | Source: Ambulatory Visit | Attending: Internal Medicine | Admitting: Internal Medicine

## 2022-02-14 DIAGNOSIS — R0602 Shortness of breath: Secondary | ICD-10-CM

## 2022-02-14 DIAGNOSIS — J45909 Unspecified asthma, uncomplicated: Secondary | ICD-10-CM | POA: Diagnosis not present

## 2022-02-14 DIAGNOSIS — R079 Chest pain, unspecified: Secondary | ICD-10-CM | POA: Diagnosis not present

## 2022-02-19 ENCOUNTER — Encounter: Payer: Self-pay | Admitting: Podiatry

## 2022-02-19 ENCOUNTER — Ambulatory Visit: Payer: Medicare Other | Admitting: Podiatry

## 2022-02-19 DIAGNOSIS — M79675 Pain in left toe(s): Secondary | ICD-10-CM | POA: Diagnosis not present

## 2022-02-19 DIAGNOSIS — M79674 Pain in right toe(s): Secondary | ICD-10-CM | POA: Diagnosis not present

## 2022-02-19 DIAGNOSIS — B351 Tinea unguium: Secondary | ICD-10-CM

## 2022-02-25 NOTE — Progress Notes (Signed)
  Subjective:  Patient ID: Audrey [name], female    DOB: 1933-06-05,  MRN: [id number]  Audrey [name] presents to clinic today for painful elongated mycotic toenails 1-5 bilaterally which are tender when wearing enclosed shoe gear. Pain is relieved with periodic professional debridement.  New problem(s): None.   PCP is Wenda Low, MD , and last visit was August 20, 2021.  Allergies  Allergen Reactions   Penicillins Other (See Comments)    Hallucinations Has patient had a PCN reaction causing immediate rash, facial/tongue/throat swelling, SOB or lightheadedness with hypotension:No Has patient had a PCN reaction causing severe rash involving mucus membranes or skin necrosis:No Has patient had a PCN reaction that required hospitalization:No Has patient had a PCN reaction occurring within the last 10 years:NO If all of the above answers are "NO", then may proceed with Cephalosporin use.    Penicillin G Benzathine     Other reaction(s): hallucinations   Prednisone     Other reaction(s): sore/swollen tongue    Review of Systems: Negative except as noted in the HPI.  Objective: No changes noted in today's physical examination. Objective: Physical Exam  General: Patient is a pleasant 88 y.o. African American female in NAD. AAO x 3.   Neurovascular Examination: Capillary refill time to digits immediate b/l. Palpable pedal pulses b/l LE. Pedal hair present. No pain with calf compression b/l. Lower extremity skin temperature gradient within normal limits. Trace edema noted BLE. No cyanosis or clubbing noted b/l LE.  Protective sensation intact 5/5 intact bilaterally with 10g monofilament b/l. Vibratory sensation intact b/l.  Dermatological:  Pedal integument with normal turgor, texture and tone BLE. No open wounds b/l LE. No interdigital macerations noted b/l LE. Toenails 1-5 b/l elongated, discolored, dystrophic, thickened, crumbly with subungual debris and tenderness to dorsal  palpation. No hyperkeratotic nor porokeratotic lesions present on today's visit.  Musculoskeletal:  Muscle strength 5/5 to all lower extremity muscle groups bilaterally. No pain, crepitus or joint limitation noted with ROM bilateral LE. HAV with bunion deformity noted b/l LE. Pes planus deformity noted bilateral LE.  Assessment/Plan: 1. Pain due to onychomycosis of toenails of both feet     -Examined patient. -No new findings. No new orders. -Patient to continue soft, supportive shoe gear daily. -Mycotic toenails 1-5 bilaterally were debrided in length and girth with sterile nail nippers and dremel without incident. -Patient/POA to call should there be question/concern in the interim.   Return in about 3 months (around 05/22/2022).  Marzetta Board, DPM

## 2022-04-18 DIAGNOSIS — D179 Benign lipomatous neoplasm, unspecified: Secondary | ICD-10-CM | POA: Diagnosis not present

## 2022-04-18 DIAGNOSIS — I10 Essential (primary) hypertension: Secondary | ICD-10-CM | POA: Diagnosis not present

## 2022-04-18 DIAGNOSIS — G629 Polyneuropathy, unspecified: Secondary | ICD-10-CM | POA: Diagnosis not present

## 2022-05-27 ENCOUNTER — Ambulatory Visit: Payer: Medicare Other | Admitting: Podiatry

## 2022-06-19 DIAGNOSIS — E7800 Pure hypercholesterolemia, unspecified: Secondary | ICD-10-CM | POA: Diagnosis not present

## 2022-06-19 DIAGNOSIS — J45909 Unspecified asthma, uncomplicated: Secondary | ICD-10-CM | POA: Diagnosis not present

## 2022-06-19 DIAGNOSIS — I10 Essential (primary) hypertension: Secondary | ICD-10-CM | POA: Diagnosis not present

## 2022-06-21 ENCOUNTER — Ambulatory Visit: Payer: Medicare Other | Admitting: Podiatry

## 2022-06-21 ENCOUNTER — Encounter: Payer: Self-pay | Admitting: Podiatry

## 2022-06-21 DIAGNOSIS — M79674 Pain in right toe(s): Secondary | ICD-10-CM | POA: Diagnosis not present

## 2022-06-21 DIAGNOSIS — B351 Tinea unguium: Secondary | ICD-10-CM | POA: Diagnosis not present

## 2022-06-21 DIAGNOSIS — M79675 Pain in left toe(s): Secondary | ICD-10-CM | POA: Diagnosis not present

## 2022-06-28 NOTE — Progress Notes (Signed)
  Subjective:  Patient ID: Audrey [name], female    DOB: 1933-09-17,  MRN: [id number]  Audrey [name] presents to clinic today for painful thick toenails that are difficult to trim. Pain interferes with ambulation. Aggravating factors include wearing enclosed shoe gear. Pain is relieved with periodic professional debridement.  Patient states went enjoyed herself traveling with her family to [place] to tour the [place].  New problem(s): None.   PCP is Wenda Low, MD , and last visit was  January 22, 2022.  Allergies  Allergen Reactions   Penicillins Other (See Comments)    Hallucinations Has patient had a PCN reaction causing immediate rash, facial/tongue/throat swelling, SOB or lightheadedness with hypotension:No Has patient had a PCN reaction causing severe rash involving mucus membranes or skin necrosis:No Has patient had a PCN reaction that required hospitalization:No Has patient had a PCN reaction occurring within the last 10 years:NO If all of the above answers are "NO", then may proceed with Cephalosporin use.    Penicillin G Benzathine     Other reaction(s): hallucinations   Prednisone     Other reaction(s): sore/swollen tongue    Review of Systems: Negative except as noted in the HPI.  Objective: No changes noted in today's physical examination.  Audrey [name] is a pleasant 88 y.o. female WD, WN in NAD. AAO x 3. Neurovascular Examination: Capillary refill time to digits immediate b/l. Palpable pedal pulses b/l LE. Pedal hair present. No pain with calf compression b/l. Lower extremity skin temperature gradient within normal limits. Trace edema noted BLE. No cyanosis or clubbing noted b/l LE.  Protective sensation intact 5/5 intact bilaterally with 10g monofilament b/l. Vibratory sensation intact b/l.  Dermatological:  Pedal integument with normal turgor, texture and tone BLE. No open wounds b/l LE. No interdigital macerations noted b/l LE. Toenails 1-5  b/l elongated, discolored, dystrophic, thickened, crumbly with subungual debris and tenderness to dorsal palpation. No hyperkeratotic nor porokeratotic lesions present on today's visit.  Musculoskeletal:  Muscle strength 5/5 to all lower extremity muscle groups bilaterally. No pain, crepitus or joint limitation noted with ROM bilateral LE. HAV with bunion deformity noted b/l LE. Pes planus deformity noted bilateral LE. Assessment/Plan: 1. Pain due to onychomycosis of toenails of both feet   -Patient was evaluated and treated. All patient's and/or POA's questions/concerns answered on today's visit. -Toenails 1-5 b/l were debrided in length and girth with sterile nail nippers and dremel without iatrogenic bleeding.  -Patient/POA to call should there be question/concern in the interim.   Return in about 3 months (around 09/20/2022).  Marzetta Board, DPM

## 2022-07-12 DIAGNOSIS — R0609 Other forms of dyspnea: Secondary | ICD-10-CM | POA: Diagnosis not present

## 2022-07-12 DIAGNOSIS — R7303 Prediabetes: Secondary | ICD-10-CM | POA: Diagnosis not present

## 2022-07-12 DIAGNOSIS — R072 Precordial pain: Secondary | ICD-10-CM | POA: Diagnosis not present

## 2022-07-12 DIAGNOSIS — I10 Essential (primary) hypertension: Secondary | ICD-10-CM | POA: Diagnosis not present

## 2022-07-17 DIAGNOSIS — J45909 Unspecified asthma, uncomplicated: Secondary | ICD-10-CM | POA: Diagnosis not present

## 2022-07-17 DIAGNOSIS — D179 Benign lipomatous neoplasm, unspecified: Secondary | ICD-10-CM | POA: Diagnosis not present

## 2022-07-17 DIAGNOSIS — M519 Unspecified thoracic, thoracolumbar and lumbosacral intervertebral disc disorder: Secondary | ICD-10-CM | POA: Diagnosis not present

## 2022-07-17 DIAGNOSIS — M810 Age-related osteoporosis without current pathological fracture: Secondary | ICD-10-CM | POA: Diagnosis not present

## 2022-07-17 DIAGNOSIS — E7800 Pure hypercholesterolemia, unspecified: Secondary | ICD-10-CM | POA: Diagnosis not present

## 2022-07-17 DIAGNOSIS — Z23 Encounter for immunization: Secondary | ICD-10-CM | POA: Diagnosis not present

## 2022-07-17 DIAGNOSIS — R7303 Prediabetes: Secondary | ICD-10-CM | POA: Diagnosis not present

## 2022-07-17 DIAGNOSIS — I10 Essential (primary) hypertension: Secondary | ICD-10-CM | POA: Diagnosis not present

## 2022-08-09 DIAGNOSIS — Z23 Encounter for immunization: Secondary | ICD-10-CM | POA: Diagnosis not present

## 2022-09-12 ENCOUNTER — Emergency Department (HOSPITAL_COMMUNITY): Payer: Medicare Other

## 2022-09-12 ENCOUNTER — Emergency Department (HOSPITAL_COMMUNITY)
Admission: EM | Admit: 2022-09-12 | Discharge: 2022-09-13 | Disposition: A | Discharge status: Home / Self Care | Payer: Medicare Other | Attending: Emergency Medicine | Admitting: Emergency Medicine

## 2022-09-12 DIAGNOSIS — R002 Palpitations: Secondary | ICD-10-CM | POA: Diagnosis not present

## 2022-09-12 DIAGNOSIS — I10 Essential (primary) hypertension: Secondary | ICD-10-CM | POA: Insufficient documentation

## 2022-09-12 DIAGNOSIS — Z7951 Long term (current) use of inhaled steroids: Secondary | ICD-10-CM | POA: Diagnosis not present

## 2022-09-12 DIAGNOSIS — R600 Localized edema: Secondary | ICD-10-CM | POA: Insufficient documentation

## 2022-09-12 DIAGNOSIS — R0789 Other chest pain: Secondary | ICD-10-CM | POA: Diagnosis not present

## 2022-09-12 DIAGNOSIS — J45909 Unspecified asthma, uncomplicated: Secondary | ICD-10-CM | POA: Diagnosis not present

## 2022-09-12 DIAGNOSIS — J439 Emphysema, unspecified: Secondary | ICD-10-CM | POA: Diagnosis not present

## 2022-09-12 DIAGNOSIS — R079 Chest pain, unspecified: Secondary | ICD-10-CM | POA: Diagnosis not present

## 2022-09-12 DIAGNOSIS — Z79899 Other long term (current) drug therapy: Secondary | ICD-10-CM | POA: Diagnosis not present

## 2022-09-12 LAB — CBC WITH DIFFERENTIAL/PLATELET
Abs Immature Granulocytes: 0.08 10*3/uL — ABNORMAL HIGH (ref 0.00–0.07)
Basophils Absolute: 0.1 10*3/uL (ref 0.0–0.1)
Basophils Relative: 1 %
Eosinophils Absolute: 0.2 10*3/uL (ref 0.0–0.5)
Eosinophils Relative: 2 %
HCT: 42.8 % (ref 36.0–46.0)
Hemoglobin: 13.6 g/dL (ref 12.0–15.0)
Immature Granulocytes: 1 %
Lymphocytes Relative: 40 %
Lymphs Abs: 3 10*3/uL (ref 0.7–4.0)
MCH: 31.6 pg (ref 26.0–34.0)
MCHC: 31.8 g/dL (ref 30.0–36.0)
MCV: 99.5 fL (ref 80.0–100.0)
Monocytes Absolute: 0.7 10*3/uL (ref 0.1–1.0)
Monocytes Relative: 9 %
Neutro Abs: 3.6 10*3/uL (ref 1.7–7.7)
Neutrophils Relative %: 47 %
Platelets: UNDETERMINED 10*3/uL (ref 150–400)
RBC: 4.3 MIL/uL (ref 3.87–5.11)
RDW: 13.4 % (ref 11.5–15.5)
WBC: 7.6 10*3/uL (ref 4.0–10.5)
nRBC: 0 % (ref 0.0–0.2)

## 2022-09-12 LAB — TROPONIN I (HIGH SENSITIVITY)
Troponin I (High Sensitivity): 11 ng/L (ref <18)
Troponin I (High Sensitivity): 19 ng/L — ABNORMAL HIGH (ref <18)

## 2022-09-12 LAB — BRAIN NATRIURETIC PEPTIDE: B Natriuretic Peptide: 55.9 pg/mL (ref 0.0–100.0)

## 2022-09-12 NOTE — ED Triage Notes (Signed)
Patient here with compliant of chest pain that started earlier today. Patient denies cardiac history, is alert, oriented, and in no apparent distress at this time.

## 2022-09-12 NOTE — ED Provider Triage Note (Signed)
Emergency Medicine Provider Triage Evaluation Note  Audrey [name] , a 88 y.o. female  was evaluated in triage.  Pt complains of chest pain, exertional dyspnea, fast heart rates with exertion.  Onset today.  Rest alleviates symptoms.  Denies prior history of MI or CAD.  Denies history of CHF.  Review of Systems  Positive: As above Negative: As above  Physical Exam  BP (!) 167/101   Pulse 91   Temp 97.7 F (36.5 C) (Oral)   Resp 18   SpO2 100%  Gen:   Awake, no distress   Resp:  Normal effort  MSK:   Moves extremities without difficulty  Other:    Medical Decision Making  Medically screening exam initiated at 3:50 PM.  Appropriate orders placed.  Audrey [name] was informed that the remainder of the evaluation will be completed by another provider, this initial triage assessment does not replace that evaluation, and the importance of remaining in the ED until their evaluation is complete.    Evlyn Courier, PA-C 09/12/22 1551

## 2022-09-13 LAB — TROPONIN I (HIGH SENSITIVITY): Troponin I (High Sensitivity): 11 ng/L (ref <18)

## 2022-09-13 NOTE — ED Provider Notes (Signed)
[facility] EMERGENCY DEPARTMENT Provider Note   CSN: [id number] Arrival date & time: 09/12/22  1506     History  Chief Complaint  Patient presents with   Chest Pain    Audrey [name] is a 88 y.o. female.  HPI     This is an 88 year old female with a history of hypertension and asthma who presents with palpitations.  Patient reports that over the last day or 2 she has experienced several episodes where she felt like her heart was racing and she had some chest discomfort.  Mostly it is when she is up walking in room.  Rest alleviates his symptoms.  No history of coronary artery disease.  No history of arrhythmia or CHF.  She is currently asymptomatic.  Denies any recent fevers or cough.  Patient has never seen a cardiologist or had a stress test to her knowledge.  Home Medications Prior to Admission medications   Medication Sig Start Date End Date Taking? Authorizing Provider  acetaminophen (TYLENOL) 325 MG tablet Take 2 tablets (650 mg total) by mouth every 6 (six) hours as needed for mild pain (or Fever >/= 101). 07/24/18   Sheikh, Georgina Quint Latif, DO  albuterol (PROVENTIL HFA;VENTOLIN HFA) 108 (90 BASE) MCG/ACT inhaler Inhale 2 puffs into the lungs every 6 (six) hours as needed for shortness of breath.     [provider]  albuterol (PROVENTIL) (2.5 MG/3ML) 0.083% nebulizer solution Inhale 2.5 mg into the lungs 3 (three) times daily as needed for shortness of breath. 01/29/18   [provider]  alendronate (FOSAMAX) 70 MG tablet Take 1 tablet by mouth once a week.    [provider]  amLODipine (NORVASC) 5 MG tablet Take 5 mg by mouth daily.    [provider]  bisacodyl (DULCOLAX) 10 MG suppository Place 1 suppository (10 mg total) rectally daily as needed for moderate constipation. 07/27/18   Raiford Noble Latif, DO  budesonide-formoterol (SYMBICORT) 160-4.5 MCG/ACT inhaler 2 puffs    [provider]  Calcium  Carb-Cholecalciferol (CALCIUM 500 + D) 500-200 MG-UNIT TABS 1 tablet with food 05/29/17   [provider]  clotrimazole-betamethasone (LOTRISONE) cream 1 application to affected area 11/26/17   [provider]  diclofenac sodium (VOLTAREN) 1 % GEL Apply 4 g topically 4 (four) times daily. 06/22/19   Hyatt, Max T, DPM  gabapentin (NEURONTIN) 100 MG capsule 1-2 capsule 07/03/20   [provider]  lovastatin (MEVACOR) 40 MG tablet Take 40 mg by mouth daily. 01/18/16   [provider]  omega-3 acid ethyl esters (LOVAZA) 1 G capsule Take 1 g by mouth daily. Reported on 04/14/2016    [provider]  OVER THE COUNTER MEDICATION Take 1 tablet by mouth daily. brain support vitamin    [provider]  polyethylene glycol (MIRALAX / GLYCOLAX) packet Take 17 g by mouth daily as needed for moderate constipation. 07/27/18   Sheikh, Omair Latif, DO  senna-docusate (SENOKOT-S) 8.6-50 MG tablet Take 1 tablet by mouth at bedtime as needed for mild constipation. 07/24/18   Raiford Noble Latif, DO  triamcinolone cream (KENALOG) 0.5 % 1 application sparingly to affected area    [provider]  valsartan (DIOVAN) 160 MG tablet 1 tablet 11/22/21   [provider]      Allergies    Penicillins, Penicillin g benzathine, and Prednisone    Review of Systems   Review of Systems  Constitutional:  Negative for fever.  Respiratory:  Negative for  shortness of breath.   Cardiovascular:  Positive for chest pain and palpitations. Negative for leg swelling.  All other systems reviewed and are negative.   Physical Exam Updated Vital Signs BP (!) 143/66   Pulse 63   Temp 98 F (36.7 C)   Resp 15   SpO2 100%  Physical Exam Vitals and nursing note reviewed.  Constitutional:      Appearance: She is well-developed.     Comments: Elderly, not ill-appearing  HENT:     Head: Normocephalic and atraumatic.  Eyes:     Pupils: Pupils are equal, round, and  reactive to light.  Cardiovascular:     Rate and Rhythm: Normal rate and regular rhythm.     Heart sounds: Normal heart sounds.  Pulmonary:     Effort: Pulmonary effort is normal. No respiratory distress.     Breath sounds: No wheezing.  Abdominal:     General: Bowel sounds are normal.     Palpations: Abdomen is soft.  Musculoskeletal:     Cervical back: Neck supple.     Right lower leg: Edema present.     Left lower leg: Edema present.     Comments: 1+ edema bilaterally  Skin:    General: Skin is warm and dry.  Neurological:     Mental Status: She is alert and oriented to person, place, and time.  Psychiatric:        Mood and Affect: Mood normal.     ED Results / Procedures / Treatments   Labs (all labs ordered are listed, but only abnormal results are displayed) Labs Reviewed  CBC WITH DIFFERENTIAL/PLATELET - Abnormal; Notable for the following components:      Result Value   Abs Immature Granulocytes 0.08 (*)    All other components within normal limits  TROPONIN I (HIGH SENSITIVITY) - Abnormal; Notable for the following components:   Troponin I (High Sensitivity) 19 (*)    All other components within normal limits  BRAIN NATRIURETIC PEPTIDE  BASIC METABOLIC PANEL  MAGNESIUM  TROPONIN I (HIGH SENSITIVITY)  TROPONIN I (HIGH SENSITIVITY)  TROPONIN I (HIGH SENSITIVITY)    EKG EKG Interpretation  Date/Time:  Thursday September 12 2022 15:48:31 EST Ventricular Rate:  87 PR Interval:  152 QRS Duration: 78 QT Interval:  352 QTC Calculation: 423 R Axis:   35 Text Interpretation: Normal sinus rhythm Septal infarct , age undetermined Abnormal ECG When compared with ECG of 23-Jul-2018 05:10, PREVIOUS ECG IS PRESENT Confirmed by Thayer Jew [phone]) on 09/13/2022 2:46:09 AM  Radiology DG Chest 2 View  Result Date: 09/12/2022 CLINICAL DATA:  Chest pain EXAM: CHEST - 2 VIEW COMPARISON:  02/14/2022 FINDINGS: The cardiac silhouette, mediastinal and hilar contours are  normal. The lungs are clear. Stable underlying emphysematous changes and pulmonary scarring. No pleural effusion or pneumothorax. No pulmonary lesions. The bony thorax is intact. IMPRESSION: Chronic lung changes but no acute cardiopulmonary findings. Electronically Signed   By: Marijo Sanes M.D.   On: 09/12/2022 16:19    Procedures Procedures    Medications Ordered in ED Medications - No data to display  ED Course/ Medical Decision Making/ A&P                           Medical Decision Making  This patient presents to the ED for concern of palpitations, chest pain, this involves an extensive number of treatment options, and is a complaint that carries with it a high  risk of complications and morbidity.  I considered the following differential and admission for this acute, potentially life threatening condition.  The differential diagnosis includes ACS, arrhythmia, PE, pneumothorax, pneumonia  MDM:    This is an 88 year old female who presents with palpitations.  Also describes some chest discomfort when she has these palpitations.  She is overall nontoxic.  Vital signs are notable for blood pressure initially of 161/79.  She currently is asymptomatic and has been asymptomatic while here in the emergency room.  The exertional portion of the symptoms is the most concerning.  Risk factors for ACS include age and hypertension history.  Initial troponin 11.  Repeat 19.  By the time I saw the patient, she had been in the emergency room for greater than 11 hours.  She has remained asymptomatic.  A third troponin was sent and is again 11.  Would favor potential arrhythmia such as paroxysmal atrial fibrillation over true ACS although I do feel she needs cardiac workup.  We discussed admission for cardiac evaluation versus close cardiac follow-up as an outpatient.  Patient states she wishes to go home.  Feel this is overall reasonable given that her troponin minimally bumped and now is again normal as well  as lack of ongoing symptoms.  Cardiology referral was placed.  She and her daughter were advised to call the office if they do not hear back.  (Labs, imaging, consults)  Labs: I Ordered, and personally interpreted labs.  The pertinent results include: CBC, BMP, troponin x 3  Imaging Studies ordered: I ordered imaging studies including chest x-ray I independently visualized and interpreted imaging. I agree with the radiologist interpretation  Additional history obtained from daughter at bedside.  External records from outside source obtained and reviewed including prior evaluations  Cardiac Monitoring: The patient was maintained on a cardiac monitor.  I personally viewed and interpreted the cardiac monitored which showed an underlying rhythm of: Sinus rhythm  Reevaluation: After the interventions noted above, I reevaluated the patient and found that they have :improved  Social Determinants of Health:  lives independently  Disposition: Discharge  Co morbidities that complicate the patient evaluation  Past Medical History:  Diagnosis Date   Asthma    Hypertension      Medicines No orders of the defined types were placed in this encounter.   I have reviewed the patients home medicines and have made adjustments as needed  Problem List / ED Course: Problem List Items Addressed This Visit   None Visit Diagnoses     Palpitations    -  Primary   Relevant Orders   Ambulatory referral to Cardiology                   Final Clinical Impression(s) / ED Diagnoses Final diagnoses:  Palpitations    Rx / DC Orders ED Discharge Orders          Ordered    Ambulatory referral to Cardiology        09/13/22 0551              Merryl Hacker, MD 09/13/22 [phone]

## 2022-09-13 NOTE — Discharge Instructions (Signed)
You were seen today for chest pain and palpitations.  Continue your medications at home.  Follow-up closely with cardiology.  You may need definitive cardiac testing if symptoms continue.

## 2022-09-19 DIAGNOSIS — G8929 Other chronic pain: Secondary | ICD-10-CM | POA: Diagnosis not present

## 2022-09-19 DIAGNOSIS — I10 Essential (primary) hypertension: Secondary | ICD-10-CM | POA: Diagnosis not present

## 2022-09-19 DIAGNOSIS — E7800 Pure hypercholesterolemia, unspecified: Secondary | ICD-10-CM | POA: Diagnosis not present

## 2022-09-19 DIAGNOSIS — J45909 Unspecified asthma, uncomplicated: Secondary | ICD-10-CM | POA: Diagnosis not present

## 2022-10-09 ENCOUNTER — Ambulatory Visit: Payer: Medicare Other | Admitting: Podiatry

## 2022-10-09 VITALS — BP 161/90

## 2022-10-09 DIAGNOSIS — M79674 Pain in right toe(s): Secondary | ICD-10-CM | POA: Diagnosis not present

## 2022-10-09 DIAGNOSIS — M79675 Pain in left toe(s): Secondary | ICD-10-CM | POA: Diagnosis not present

## 2022-10-09 DIAGNOSIS — B351 Tinea unguium: Secondary | ICD-10-CM

## 2022-10-09 DIAGNOSIS — L600 Ingrowing nail: Secondary | ICD-10-CM

## 2022-10-09 NOTE — Progress Notes (Signed)
  Subjective:  Patient ID: Audrey [name], female    DOB: April 24, 1933,  MRN: [id number]  [name] presents to clinic today for painful elongated mycotic toenails 1-5 bilaterally which are tender when wearing enclosed shoe gear. Pain is relieved with periodic professional debridement.  Chief Complaint  Patient presents with   Nail Problem    [facility] PCP-Hussain, Karrar PCP [id number]    New problem(s): Patient relates painful ingrown toenail right great toe lateral border for the past few days. She has been applying methiolate to the toe, but states she recently threw it away.  PCP is Wenda Low, MD.  Allergies  Allergen Reactions   Penicillins Other (See Comments)    Hallucinations Has patient had a PCN reaction causing immediate rash, facial/tongue/throat swelling, SOB or lightheadedness with hypotension:No Has patient had a PCN reaction causing severe rash involving mucus membranes or skin necrosis:No Has patient had a PCN reaction that required hospitalization:No Has patient had a PCN reaction occurring within the last 10 years:NO If all of the above answers are "NO", then may proceed with Cephalosporin use.    Penicillin G Benzathine     Other reaction(s): hallucinations   Prednisone     Other reaction(s): sore/swollen tongue    Review of Systems: Negative except as noted in the HPI.  Objective:  Vitals:   10/09/22 1035  BP: (!) 161/90   Audrey [name] is a pleasant 89 y.o. female WD, WN in NAD. AAO x 3.  Neurovascular Examination: Capillary refill time to digits immediate b/l. Palpable pedal pulses b/l LE. Pedal hair present. No pain with calf compression b/l. Lower extremity skin temperature gradient within normal limits. Trace edema noted BLE. No cyanosis or clubbing noted b/l LE.  Protective sensation intact 5/5 intact bilaterally with 10g monofilament b/l. Vibratory sensation intact b/l.  Dermatological:  Pedal integument with normal turgor, texture and  tone BLE. No open wounds b/l LE. No interdigital macerations noted b/l LE. Toenails left great toe and 2-5 b/l elongated, discolored, dystrophic, thickened, crumbly with subungual debris and tenderness to dorsal palpation. No hyperkeratotic nor porokeratotic lesions present on today's visit.  Incurvated nailplate right great toe lateral border(s) with tenderness to palpation. No erythema, no edema, no drainage noted.   Musculoskeletal:  Muscle strength 5/5 to all lower extremity muscle groups bilaterally. No pain, crepitus or joint limitation noted with ROM bilateral LE. HAV with bunion deformity noted b/l LE. Pes planus deformity noted bilateral LE.  Assessment/Plan: 1. Pain due to onychomycosis of toenails of both feet     No orders of the defined types were placed in this encounter.  -Consent given for treatment as described below: -Examined patient. -Continue supportive shoe gear daily. -Mycotic toenails 1-5 bilaterally were debrided in length and girth with sterile nail nippers and dremel without incident. -No invasive procedure(s) performed. Offending nail border debrided and curretaged right great toe utilizing sterile nail nipper and currette. Border(s) cleansed with alcohol and triple antibiotic ointment applied. Dispensed written instructions for once daily epsom salt soaks for 3 days. Call office if there are any concerns. Advised patient if condition does not improve, she may need to have partial nail avulsion of symptomatic border. She related understanding -Patient/POA to call should there be question/concern in the interim.   Return in about 3 months (around 01/08/2023).  Marzetta Board, DPM

## 2022-10-09 NOTE — Patient Instructions (Signed)
EPSOM SALT FOOT SOAK INSTRUCTIONS  *IF YOU HAVE BEEN PRESCRIBED ANTIBIOTICS, TAKE AS INSTRUCTED UNTIL ALL ARE GONE*  Shopping List:  A. Plain epsom salt (not scented) B. Neosporin Cream/Ointment or Bacitracin Cream/Ointment (or prescribed antiobiotic drops/cream/ointment) C. 1-inch fabric band-aids   Place 1/4 cup of epsom salts in 2 quarts of warm tap water. IF YOU ARE DIABETIC, OR HAVE NEUROPATHY, CHECK THE TEMPERATURE OF THE WATER WITH YOUR ELBOW.   Submerge your foot/feet in the solution and soak for 10-15 minutes.      3.  Next, remove your foot/feet from solution, blot dry the affected area.    4.  Apply light amount of antibiotic cream/ointment and cover with fabric band-aid .  5.  This soak should be done once a day for 3 days.   6.  Monitor for any signs/symptoms of infection such as redness, swelling, odor, drainage, increased pain, or non-healing of digit.   7.  Please do not hesitate to call the office and speak to a Nurse or Doctor if you have questions.   8.  If you experience fever, chills, nightsweats, nausea or vomiting with worsening of digit/foot, please go to the emergency room.

## 2022-10-14 ENCOUNTER — Encounter: Payer: Self-pay | Admitting: Podiatry

## 2022-10-15 ENCOUNTER — Ambulatory Visit: Payer: Medicare Other | Admitting: Cardiology

## 2022-11-27 DIAGNOSIS — J45909 Unspecified asthma, uncomplicated: Secondary | ICD-10-CM | POA: Diagnosis not present

## 2022-11-27 DIAGNOSIS — M810 Age-related osteoporosis without current pathological fracture: Secondary | ICD-10-CM | POA: Diagnosis not present

## 2022-11-27 DIAGNOSIS — E7800 Pure hypercholesterolemia, unspecified: Secondary | ICD-10-CM | POA: Diagnosis not present

## 2022-11-27 DIAGNOSIS — I10 Essential (primary) hypertension: Secondary | ICD-10-CM | POA: Diagnosis not present

## 2022-12-26 DIAGNOSIS — R7303 Prediabetes: Secondary | ICD-10-CM | POA: Diagnosis not present

## 2022-12-26 DIAGNOSIS — M810 Age-related osteoporosis without current pathological fracture: Secondary | ICD-10-CM | POA: Diagnosis not present

## 2022-12-26 DIAGNOSIS — R0789 Other chest pain: Secondary | ICD-10-CM | POA: Diagnosis not present

## 2022-12-26 DIAGNOSIS — D179 Benign lipomatous neoplasm, unspecified: Secondary | ICD-10-CM | POA: Diagnosis not present

## 2022-12-26 DIAGNOSIS — Z0000 Encounter for general adult medical examination without abnormal findings: Secondary | ICD-10-CM | POA: Diagnosis not present

## 2022-12-26 DIAGNOSIS — J4489 Other specified chronic obstructive pulmonary disease: Secondary | ICD-10-CM | POA: Diagnosis not present

## 2022-12-26 DIAGNOSIS — R269 Unspecified abnormalities of gait and mobility: Secondary | ICD-10-CM | POA: Diagnosis not present

## 2022-12-26 DIAGNOSIS — I10 Essential (primary) hypertension: Secondary | ICD-10-CM | POA: Diagnosis not present

## 2022-12-26 DIAGNOSIS — K219 Gastro-esophageal reflux disease without esophagitis: Secondary | ICD-10-CM | POA: Diagnosis not present

## 2022-12-26 DIAGNOSIS — G629 Polyneuropathy, unspecified: Secondary | ICD-10-CM | POA: Diagnosis not present

## 2022-12-26 DIAGNOSIS — E7800 Pure hypercholesterolemia, unspecified: Secondary | ICD-10-CM | POA: Diagnosis not present

## 2023-01-06 ENCOUNTER — Emergency Department (HOSPITAL_COMMUNITY)
Admission: EM | Admit: 2023-01-06 | Discharge: 2023-01-29 | Disposition: E | Payer: Medicare Other | Attending: Emergency Medicine | Admitting: Emergency Medicine

## 2023-01-06 ENCOUNTER — Encounter (HOSPITAL_COMMUNITY): Payer: Self-pay

## 2023-01-06 ENCOUNTER — Emergency Department (HOSPITAL_COMMUNITY): Payer: Medicare Other

## 2023-01-06 DIAGNOSIS — I469 Cardiac arrest, cause unspecified: Secondary | ICD-10-CM | POA: Diagnosis not present

## 2023-01-06 DIAGNOSIS — J45909 Unspecified asthma, uncomplicated: Secondary | ICD-10-CM | POA: Diagnosis not present

## 2023-01-06 DIAGNOSIS — R1110 Vomiting, unspecified: Secondary | ICD-10-CM | POA: Diagnosis not present

## 2023-01-06 DIAGNOSIS — Z743 Need for continuous supervision: Secondary | ICD-10-CM | POA: Diagnosis not present

## 2023-01-06 DIAGNOSIS — I10 Essential (primary) hypertension: Secondary | ICD-10-CM | POA: Diagnosis not present

## 2023-01-06 DIAGNOSIS — R404 Transient alteration of awareness: Secondary | ICD-10-CM | POA: Diagnosis not present

## 2023-01-06 DIAGNOSIS — R6889 Other general symptoms and signs: Secondary | ICD-10-CM | POA: Diagnosis not present

## 2023-01-06 DIAGNOSIS — R799 Abnormal finding of blood chemistry, unspecified: Secondary | ICD-10-CM | POA: Diagnosis not present

## 2023-01-06 DIAGNOSIS — R079 Chest pain, unspecified: Secondary | ICD-10-CM | POA: Diagnosis not present

## 2023-01-06 LAB — I-STAT CHEM 8, ED
BUN: 22 mg/dL (ref 8–23)
Calcium, Ion: 1.24 mmol/L (ref 1.15–1.40)
Chloride: 109 mmol/L (ref 98–111)
Creatinine, Ser: 1.2 mg/dL — ABNORMAL HIGH (ref 0.44–1.00)
Glucose, Bld: 417 mg/dL — ABNORMAL HIGH (ref 70–99)
HCT: 41 % (ref 36.0–46.0)
Hemoglobin: 13.9 g/dL (ref 12.0–15.0)
Potassium: 5 mmol/L (ref 3.5–5.1)
Sodium: 140 mmol/L (ref 135–145)
TCO2: 18 mmol/L — ABNORMAL LOW (ref 22–32)

## 2023-01-06 LAB — I-STAT VENOUS BLOOD GAS, ED
Acid-base deficit: 17 mmol/L — ABNORMAL HIGH (ref 0.0–2.0)
Bicarbonate: 15.3 mmol/L — ABNORMAL LOW (ref 20.0–28.0)
Calcium, Ion: 1.24 mmol/L (ref 1.15–1.40)
HCT: 40 % (ref 36.0–46.0)
Hemoglobin: 13.6 g/dL (ref 12.0–15.0)
O2 Saturation: 33 %
Potassium: 4.9 mmol/L (ref 3.5–5.1)
Sodium: 140 mmol/L (ref 135–145)
TCO2: 17 mmol/L — ABNORMAL LOW (ref 22–32)
pCO2, Ven: 66.8 mmHg — ABNORMAL HIGH (ref 44–60)
pH, Ven: 6.969 — CL (ref 7.25–7.43)
pO2, Ven: 32 mmHg (ref 32–45)

## 2023-01-06 LAB — CBG MONITORING, ED: Glucose-Capillary: 203 mg/dL — ABNORMAL HIGH (ref 70–99)

## 2023-01-06 MED ORDER — AMIODARONE IV BOLUS ONLY 150 MG/100ML
INTRAVENOUS | Status: AC | PRN
  Administered 2023-01-06: 150 mg via INTRAVENOUS

## 2023-01-06 MED ORDER — EPINEPHRINE 1 MG/10ML IJ SOSY
PREFILLED_SYRINGE | INTRAMUSCULAR | Status: AC | PRN
  Administered 2023-01-06: 1 mg via INTRAVENOUS

## 2023-01-06 MED ORDER — EPINEPHRINE 1 MG/10ML IJ SOSY
PREFILLED_SYRINGE | INTRAMUSCULAR | Status: AC | PRN
  Administered 2023-01-06 (×2): 1 mg via INTRAVENOUS

## 2023-01-06 MED ORDER — SODIUM BICARBONATE 8.4 % IV SOLN
INTRAVENOUS | Status: AC | PRN
  Administered 2023-01-06 (×2): 50 meq via INTRAVENOUS

## 2023-01-06 MED ORDER — CALCIUM CHLORIDE 10 % IV SOLN
INTRAVENOUS | Status: AC | PRN
  Administered 2023-01-06: 1 g via INTRAVENOUS

## 2023-01-06 MED ORDER — MAGNESIUM SULFATE 50 % IJ SOLN
INTRAMUSCULAR | Status: AC | PRN
  Administered 2023-01-06: 2 g via INTRAVENOUS

## 2023-01-20 ENCOUNTER — Ambulatory Visit: Payer: Medicare Other | Admitting: Podiatry

## 2023-01-29 NOTE — ED Notes (Signed)
Patient next of kin Talmadge Coventry was given a call , unfortunately did not answer. Message to call back was left into Mrs Margaretha Glassing voicemail.

## 2023-01-29 NOTE — ED Triage Notes (Signed)
Patient arrives with ems from home patient c/o chest pain and palpitation, she received 6 mg adenosine and went into svt, second 6 mg adenosine  was given and patient cardiac arrested. Patient received 4 round of epi.

## 2023-01-29 NOTE — ED Provider Notes (Signed)
Emergency Department Provider Note   I have reviewed the triage vital signs and the nursing notes.   HISTORY  Chief Complaint Cardiac Arrest   HPI Audrey [name] is a 89 y.o. female history of palpitations and hypertension due to see cardiology soon the presents the ER today as a cardiac arrest.  Patient I will offer any history to me but apparently she called EMS for 20 minutes of palpitations.  On arrival they found her to be in SVT.  She was given 6 mg of adenosine with good response but about a minute later she went back into SVT.  12 mg of adenosine was given and within a minute she had agonal breathing and lost pulses.  CPR was initiated.  Epinephrine was given.  Patient was intubated.  4 doses of epinephrine prior to arrival.  It sounds like when they moved her out of the truck her ET tube became dislodged. LEVEL V CAVEAT APPLIES SECONDARY TO CPR in progress   Past Medical History:  Diagnosis Date   Asthma    Hypertension     Patient Active Problem List   Diagnosis Date Noted   Chronic pain 10/10/2020   Gait abnormality 10/10/2020   Gastroesophageal reflux disease 10/10/2020   Lipoma 10/10/2020   Lymphedema 10/10/2020   Major depression in remission 10/10/2020   Medication management 10/10/2020   Mild recurrent major depression 10/10/2020   Neuropathy 10/10/2020   Nummular eczema 10/10/2020   Osteoporosis 10/10/2020   Pancytopenia 10/10/2020   Pure hypercholesterolemia 10/10/2020   Left-sided low back pain with left-sided sciatica 10/10/2020   Unspecified thoracic, thoracolumbar and lumbosacral intervertebral disc disorder 10/10/2020   Patellar fracture 07/22/2018   Asthma 07/22/2018   HLD (hyperlipidemia) 07/22/2018   Pre-diabetes 07/22/2018   Essential hypertension 07/22/2018   Pre-syncope 07/22/2018   Dizziness 07/22/2018   Status asthmaticus 11/18/2013   Asthma with acute exacerbation 10/09/2012    Past Surgical History:  Procedure Laterality Date    ABDOMINAL HYSTERECTOMY     BREAST BIOPSY Right 1999   Stereo Core   BREAST EXCISIONAL BIOPSY Right 1995    Current Outpatient Rx   Order #: [id number] Class: No Print   Order #: [id number] Class: Historical Med   Order #: [id number] Class: Historical Med   Order #: [id number] Class: Historical Med   Order #: [id number] Class: Historical Med   Order #: [id number] Class: Normal   Order #: [id number] Class: Historical Med   Order #: [id number] Class: Historical Med   Order #: [id number] Class: Historical Med   Order #: [id number] Class: Normal   Order #: [id number] Class: Historical Med   Order #: [id number] Class: Historical Med   Order #: [id number] Class: Historical Med   Order #: [id number] Class: Historical Med   Order #: [id number] Class: Normal   Order #: [id number] Class: No Print   Order #: [id number] Class: Historical Med   Order #: [id number] Class: Historical Med    Allergies Penicillins, Penicillin g benzathine, and Prednisone  Family History  Problem Relation Age of Onset   Breast cancer Mother    Lung cancer Father     Social History Social History   Tobacco Use   Smoking status: Never   Smokeless tobacco: Never  Substance Use Topics   Alcohol use: Yes    Comment: ocassional   Drug use: No    Review of Systems  LEVEL V CAVEAT APPLIES SECONDARY TO CPR in Progress ____________________________________________  PHYSICAL EXAM:  VITAL SIGNS: ED Triage Vitals  Enc Vitals Group     BP 01/21/2023 0245 (!) 158/26  Pulse Rate 23-Jan-2023 0236 (!) 190     Resp Jan 23, 2023 0236 (!) 24     Temp --      Temp src --      SpO2 Jan 23, 2023 0245 (!) 77 %    Constitutional: Well appearing. Well nourished. Eyes: Conjunctivae are normal. Unreactive Pupils.  Head: Atraumatic. Nose: No congestion/rhinnorhea. Mouth/Throat: Mucous membranes are moist.  Oropharynx non-erythematous. Neck: No stridor.  No meningeal signs.   Cardiovascular: No rate, No rhythm.  Respiratory: No respiratory effort.  Lungs  diminished and wheezy Gastrointestinal: Soft and nontender. No distention.  Musculoskeletal: No lower extremity tenderness nor edema. No gross deformities of extremities. Neurologic:  Not able to assess 2/2 condition.  Skin:   No rash noted. Psych: Not able to assess 2/2 condition.   ____________________________________________   LABS (all labs ordered are listed, but only abnormal results are displayed)  Labs Reviewed  CBG MONITORING, ED - Abnormal; Notable for the following components:      Result Value   Glucose-Capillary 203 (*)    All other components within normal limits  I-STAT CHEM 8, ED - Abnormal; Notable for the following components:   Creatinine, Ser 1.20 (*)    Glucose, Bld 417 (*)    TCO2 18 (*)    All other components within normal limits  I-STAT VENOUS BLOOD GAS, ED - Abnormal; Notable for the following components:   pH, Ven 6.969 (*)    pCO2, Ven 66.8 (*)    Bicarbonate 15.3 (*)    TCO2 17 (*)    Acid-base deficit 17.0 (*)    All other components within normal limits  BASIC METABOLIC PANEL  CBC  TROPONIN I (HIGH SENSITIVITY)   ____________________________________________  EKG  N/a ____________________________________________  RADIOLOGY  No results found. ____________________________________________   PROCEDURES  Procedure(s) performed:   Procedure Name: Intubation Date/Time: 23-Jan-2023 3:04 AM  Performed by: Marily Memos, MDPre-anesthesia Checklist: Patient identified, Patient being monitored, Emergency Drugs available, Timeout performed and Suction available Oxygen Delivery Method: Non-rebreather mask Preoxygenation: Pre-oxygenation with 100% oxygen Ventilation: Mask ventilation without difficulty Laryngoscope Size: Glidescope and 4 Grade View: Grade I Tube size: 7.5 mm Number of attempts: 1 Airway Equipment and Method: Rigid stylet Placement Confirmation: ETT inserted through vocal cords under direct vision, CO2 detector and Breath  sounds checked- equal and bilateral Secured at: 24 cm Tube secured with: ETT holder Dental Injury: Bloody posterior oropharynx  Comments: Significant oropharyngeal blood prior to my intubation. No obvious injury.     .Critical Care  Performed by: Marily Memos, MD Authorized by: Marily Memos, MD   Critical care provider statement:    Critical care time (minutes):  30   Critical care was necessary to treat or prevent imminent or life-threatening deterioration of the following conditions:  Cardiac failure and respiratory failure   Critical care was time spent personally by me on the following activities:  Development of treatment plan with patient or surrogate, discussions with consultants, evaluation of patient's response to treatment, examination of patient, ordering and review of laboratory studies, ordering and review of radiographic studies, ordering and performing treatments and interventions, pulse oximetry, re-evaluation of patient's condition and review of old charts CPR  Date/Time: 01-23-2023 3:05 AM  Performed by: Marily Memos, MD Authorized by: Marily Memos, MD  CPR Procedure Details:    ACLS/BLS initiated by EMS: Yes     CPR/ACLS performed in the ED: Yes     Duration of CPR (minutes):  50   Outcome: Pt declared dead  CPR performed via ACLS guidelines under my direct supervision.  See RN documentation for details including defibrillator use, medications, doses and timing. Comments:     CPR via EMS X 25 minutes, 25 minutes here a well, see code documentation by nursing for more info.   ____________________________________________   INITIAL IMPRESSION / ASSESSMENT AND PLAN / ED COURSE  Pertinent labs & imaging results that were available during my care of the patient were reviewed by me and considered in my medical decision making (see chart for details).   Patient received multiple doses of epinephrine and bicarb in the emergency room.  Her blood gas came back with  significant acidosis likely related not being ventilated for period of time without the ET tube.  Her electrolytes were normal limits.  She did have an episode where her her PEA was more wide-complex so amiodarone and magnesium given.  The rhythm strips by EMS were reviewed and was deafly in narrow complex atrial tachycardia.  No evidence of an aberrant pathway.  Patient received high-quality CPR multiple medications, intubation in the emergency room without any change in her cardiac status.  Side ultrasound showed no cardiac activity.  Patient was ultimately declared dead at [id number]. ME contacted at [phone] and will review her case and return a call to determine if she will be an ME case.  [id number]: contacted only person in her contact list which was Mr. Dominga FerryMilford Marshall. He suggested I also speak to Talmadge CoventryLoretta Jenkins, however her phone number in the system is the same as the patient's. Mr. Wende MottMilford did not know her number nor did he know any other family member's names or numbers.   0400: discussed with ME again, will not be a ME case. Suggest having PCP do death certificate. If they aren't willing or uncomfortable I can do it.  ____________________________________________  FINAL CLINICAL IMPRESSION(S) / ED DIAGNOSES  Final diagnoses:  Cardiac arrest    MEDICATIONS GIVEN DURING THIS VISIT:  Medications  EPINEPHrine (ADRENALIN) 1 MG/10ML injection (1 mg Intravenous Given 01/02/2023 0238)    NEW OUTPATIENT MEDICATIONS STARTED DURING THIS VISIT:  New Prescriptions   No medications on file    Note:  This document was prepared using Dragon voice recognition software and may include unintentional dictation errors.     Jayceion Lisenby, Barbara CowerJason, MD 09/18/2023 [phone]

## 2023-01-29 DEATH — deceased

## 2024-02-25 IMAGING — CR DG CHEST 2V
2 series · 2 of 2 positions shown · non-contrast
Comparison: Radiographs 04/16/2018 and 10/24/2016.

CLINICAL DATA: Shortness of breath with chest pain.

EXAM:
CHEST - 2 VIEW

[w chest pa]
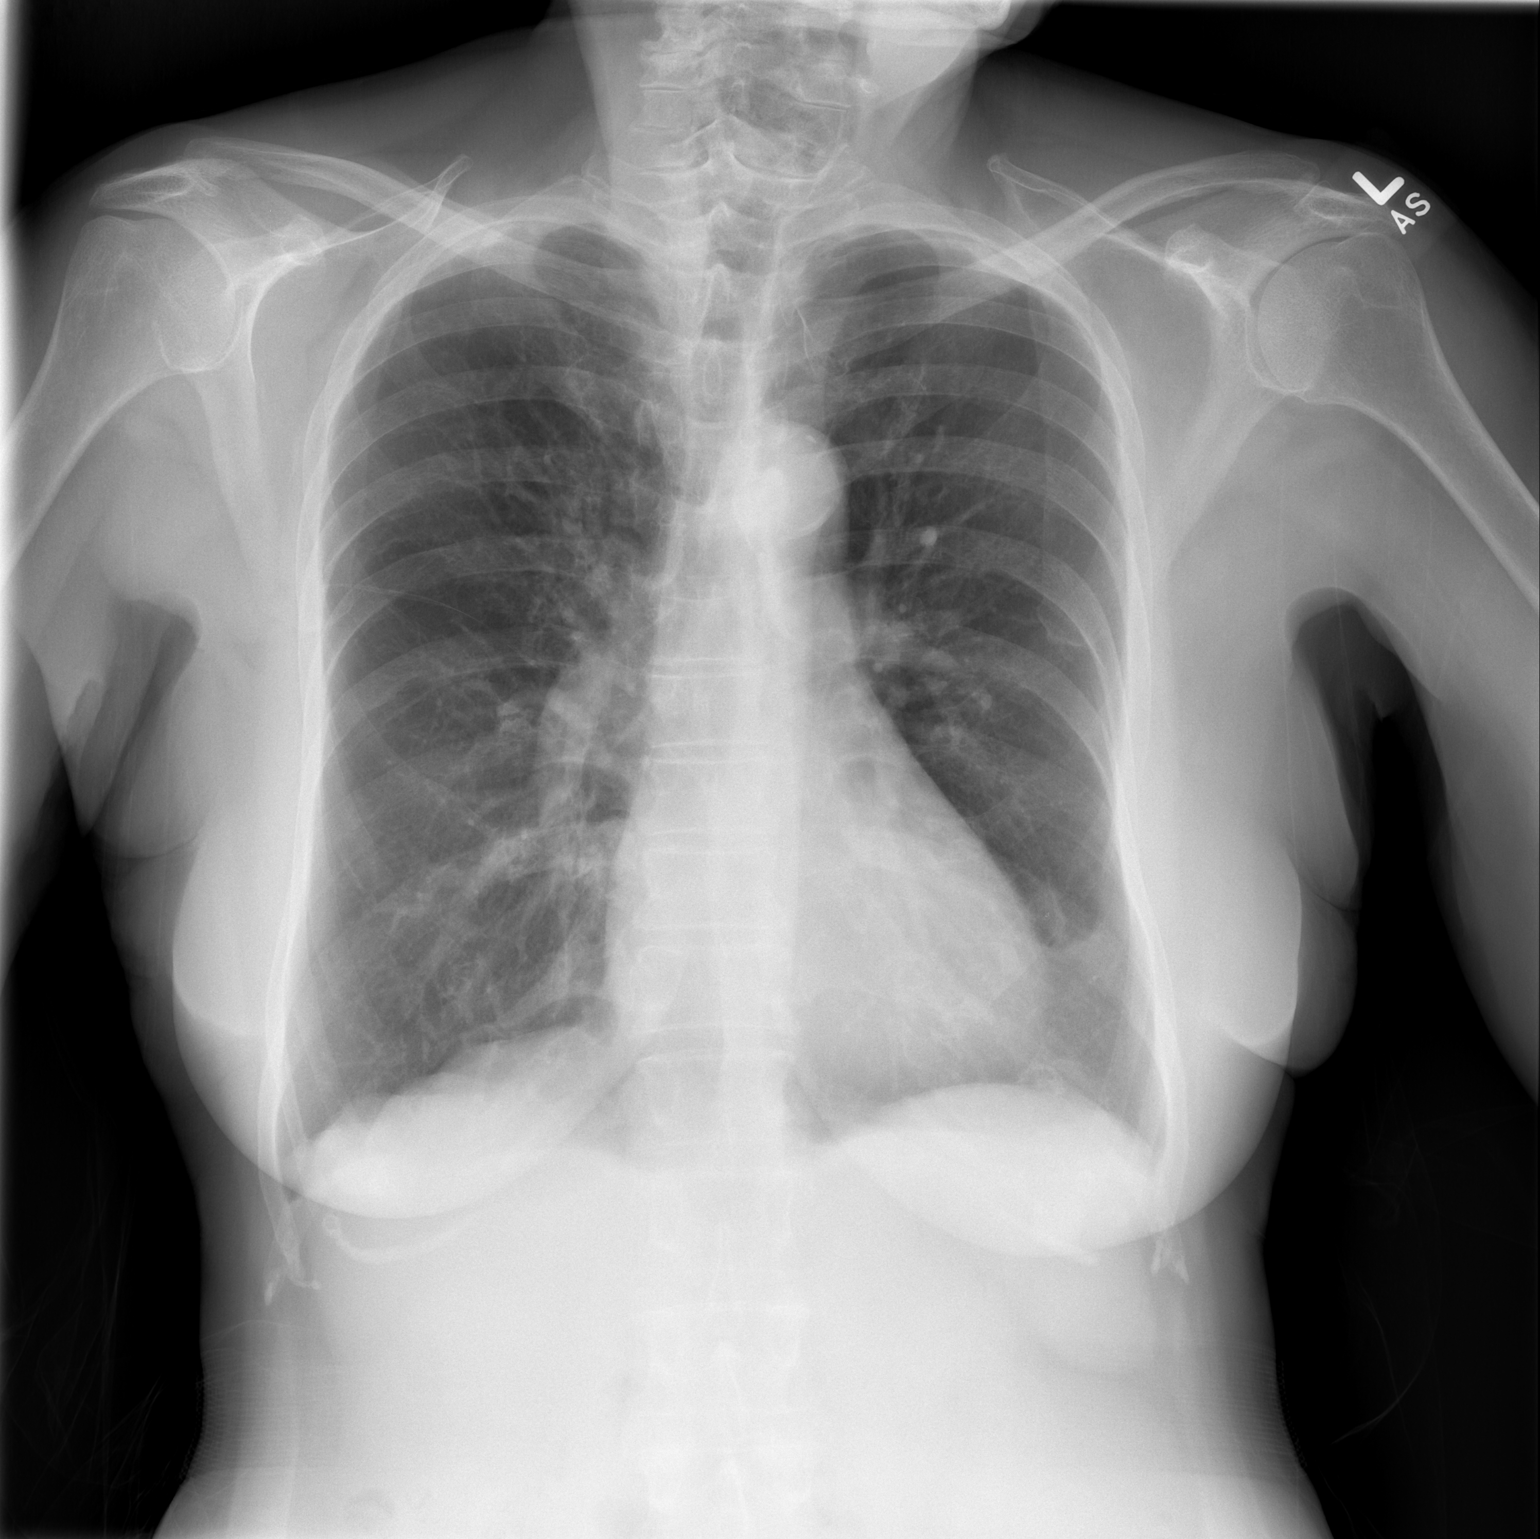

[w chest lat]
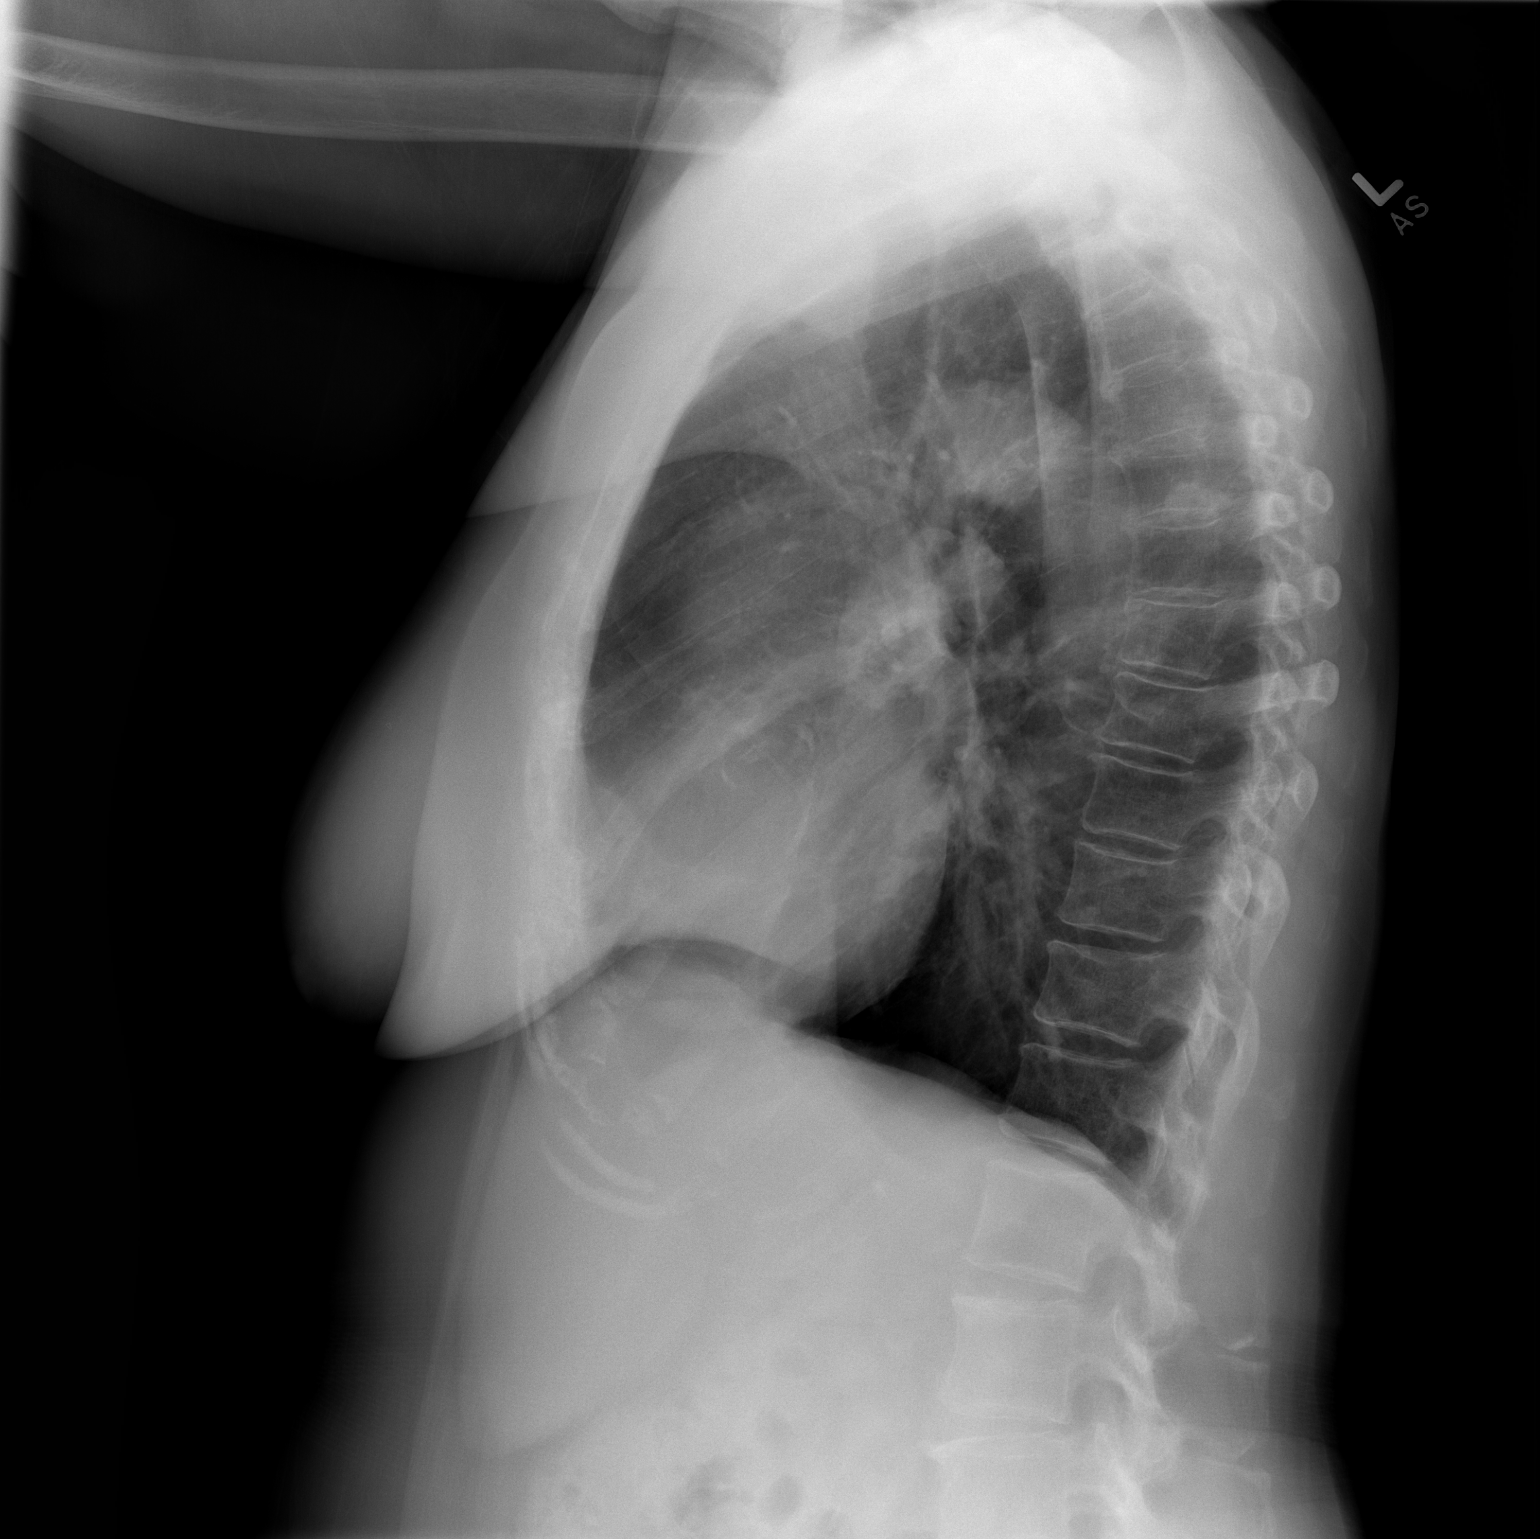

[2 of 2 positions shown; findings below may reference images not displayed]

FINDINGS: The heart size and mediastinal contours are stable. The lungs appear
mildly hyperinflated but clear. There is no pleural effusion or
pneumothorax. The bones appear unremarkable.
IMPRESSION: No evidence of active cardiopulmonary process.
